# Patient Record
Sex: Female | Born: 1980
Health system: Southern US, Community
[De-identification: ages and names within clinical notes are randomized; demographics above are authoritative.]

## PROBLEM LIST (undated history)

## (undated) DIAGNOSIS — F419 Anxiety disorder, unspecified: Secondary | ICD-10-CM

## (undated) DIAGNOSIS — D649 Anemia, unspecified: Secondary | ICD-10-CM

## (undated) HISTORY — PX: ABDOMINAL HYSTERECTOMY: SHX81

---
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
## 2002-03-14 ENCOUNTER — Other Ambulatory Visit: Admission: RE | Admit: 2002-03-14 | Discharge: 2002-03-14 | Payer: Self-pay | Admitting: Obstetrics & Gynecology

## 2003-03-19 ENCOUNTER — Other Ambulatory Visit: Admission: RE | Admit: 2003-03-19 | Discharge: 2003-03-19 | Payer: Self-pay | Admitting: Obstetrics and Gynecology

## 2003-09-17 ENCOUNTER — Inpatient Hospital Stay (HOSPITAL_COMMUNITY): Admission: AD | Admit: 2003-09-17 | Discharge: 2003-09-17 | Payer: Self-pay | Admitting: Obstetrics and Gynecology

## 2003-10-02 ENCOUNTER — Inpatient Hospital Stay (HOSPITAL_COMMUNITY): Admission: RE | Admit: 2003-10-02 | Discharge: 2003-10-05 | Payer: Self-pay | Admitting: Obstetrics and Gynecology

## 2004-04-23 ENCOUNTER — Other Ambulatory Visit: Admission: RE | Admit: 2004-04-23 | Discharge: 2004-04-23 | Payer: Self-pay | Admitting: Obstetrics and Gynecology

## 2004-10-24 ENCOUNTER — Other Ambulatory Visit: Admission: RE | Admit: 2004-10-24 | Discharge: 2004-10-24 | Payer: Self-pay | Admitting: Obstetrics and Gynecology

## 2005-04-27 ENCOUNTER — Other Ambulatory Visit: Admission: RE | Admit: 2005-04-27 | Discharge: 2005-04-27 | Payer: Self-pay | Admitting: Obstetrics and Gynecology

## 2007-08-10 ENCOUNTER — Inpatient Hospital Stay (HOSPITAL_COMMUNITY): Admission: AD | Admit: 2007-08-10 | Discharge: 2007-08-10 | Payer: Self-pay | Admitting: Obstetrics & Gynecology

## 2008-03-22 ENCOUNTER — Encounter (INDEPENDENT_AMBULATORY_CARE_PROVIDER_SITE_OTHER): Payer: Self-pay | Admitting: Obstetrics and Gynecology

## 2008-03-22 ENCOUNTER — Inpatient Hospital Stay (HOSPITAL_COMMUNITY): Admission: RE | Admit: 2008-03-22 | Discharge: 2008-03-24 | Payer: Self-pay | Admitting: Obstetrics and Gynecology

## 2008-10-22 ENCOUNTER — Emergency Department (HOSPITAL_COMMUNITY): Admission: EM | Admit: 2008-10-22 | Discharge: 2008-10-22 | Payer: Self-pay | Admitting: Emergency Medicine

## 2009-01-21 ENCOUNTER — Ambulatory Visit: Payer: Self-pay | Admitting: Family Medicine

## 2009-01-21 DIAGNOSIS — F988 Other specified behavioral and emotional disorders with onset usually occurring in childhood and adolescence: Secondary | ICD-10-CM | POA: Insufficient documentation

## 2009-01-21 DIAGNOSIS — F411 Generalized anxiety disorder: Secondary | ICD-10-CM | POA: Insufficient documentation

## 2009-01-21 DIAGNOSIS — L503 Dermatographic urticaria: Secondary | ICD-10-CM

## 2009-02-13 ENCOUNTER — Ambulatory Visit: Payer: Self-pay | Admitting: Family Medicine

## 2009-02-13 LAB — CONVERTED CEMR LAB
Cholesterol: 194 mg/dL (ref 0–200)
HDL: 71 mg/dL (ref >39)
LDL Cholesterol: 99 mg/dL (ref 0–99)
Triglycerides: 119 mg/dL (ref <150)
VLDL: 24 mg/dL (ref 0–40)

## 2009-02-20 ENCOUNTER — Ambulatory Visit: Payer: Self-pay | Admitting: Family Medicine

## 2009-02-20 LAB — CONVERTED CEMR LAB
Basophils Absolute: 0.1 10*3/uL (ref 0.0–0.1)
Basophils Relative: 1 % (ref 0–1)
Eosinophils Relative: 2 % (ref 0–5)
Hemoglobin: 13 g/dL (ref 12.0–15.0)
Lymphocytes Relative: 28 % (ref 12–46)
MCHC: 33.4 g/dL (ref 30.0–36.0)
Monocytes Absolute: 0.7 10*3/uL (ref 0.1–1.0)
Neutro Abs: 4.5 10*3/uL (ref 1.7–7.7)
Platelets: 300 10*3/uL (ref 150–400)
RDW: 12.5 % (ref 11.5–15.5)
Sed Rate: 9 mm/hr (ref 0–22)

## 2009-02-21 ENCOUNTER — Encounter (INDEPENDENT_AMBULATORY_CARE_PROVIDER_SITE_OTHER): Payer: Self-pay | Admitting: Family Medicine

## 2009-02-26 ENCOUNTER — Encounter (INDEPENDENT_AMBULATORY_CARE_PROVIDER_SITE_OTHER): Payer: Self-pay | Admitting: Family Medicine

## 2009-03-05 ENCOUNTER — Encounter (INDEPENDENT_AMBULATORY_CARE_PROVIDER_SITE_OTHER): Payer: Self-pay | Admitting: Family Medicine

## 2009-04-16 ENCOUNTER — Encounter (INDEPENDENT_AMBULATORY_CARE_PROVIDER_SITE_OTHER): Payer: Self-pay | Admitting: Family Medicine

## 2009-04-16 ENCOUNTER — Ambulatory Visit: Payer: Self-pay | Admitting: Family Medicine

## 2009-04-16 LAB — CONVERTED CEMR LAB
ALT: 11 units/L (ref 0–35)
Alkaline Phosphatase: 63 units/L (ref 39–117)
BUN: 8 mg/dL (ref 6–23)
CO2: 26 meq/L (ref 19–32)
Calcium: 9.2 mg/dL (ref 8.4–10.5)
Chloride: 103 meq/L (ref 96–112)
Creatinine, Ser: 0.86 mg/dL (ref 0.40–1.20)
Sodium: 139 meq/L (ref 135–145)
Total Bilirubin: 0.3 mg/dL (ref 0.3–1.2)
Total Protein: 7.1 g/dL (ref 6.0–8.3)

## 2009-08-19 ENCOUNTER — Emergency Department (HOSPITAL_COMMUNITY): Admission: EM | Admit: 2009-08-19 | Discharge: 2009-08-19 | Payer: Self-pay | Admitting: Family Medicine

## 2010-04-01 NOTE — Letter (Signed)
Summary: PT INFORMATION SHEET  PT INFORMATION SHEET   Imported By: Arta Bruce 03/13/2009 15:05:06  _____________________________________________________________________  External Attachment:    Type:   Image     Comment:   External Document

## 2010-04-01 NOTE — Letter (Signed)
Summary: RECEIVED RECORDS FROM DR.[facility]  RECEIVED RECORDS FROM DR.KAUR   Imported By: Arta Bruce 05/02/2009 15:28:23  _____________________________________________________________________  External Attachment:    Type:   Image     Comment:   External Document

## 2010-04-01 NOTE — Letter (Signed)
Summary: REQUESTING RECORDS FROM DR.KAUR/PSY.  REQUESTING RECORDS FROM DR.KAUR/PSY.   Imported By: Arta Bruce 04/29/2009 16:00:10  _____________________________________________________________________  External Attachment:    Type:   Image     Comment:   External Document

## 2010-04-01 NOTE — Letter (Signed)
Summary: SLIDING SCALE FEE  SLIDING SCALE FEE   Imported By: Arta Bruce 03/13/2009 15:06:15  _____________________________________________________________________  External Attachment:    Type:   Image     Comment:   External Document

## 2010-05-18 LAB — POCT RAPID STREP A (OFFICE): Streptococcus, Group A Screen (Direct): NEGATIVE

## 2010-06-16 LAB — CBC
HCT: 29.9 % — ABNORMAL LOW (ref 36.0–46.0)
HCT: 34 % — ABNORMAL LOW (ref 36.0–46.0)
Hemoglobin: 10.2 g/dL — ABNORMAL LOW (ref 12.0–15.0)
Hemoglobin: 11.5 g/dL — ABNORMAL LOW (ref 12.0–15.0)
Platelets: 166 10*3/uL (ref 150–400)
RBC: 3.19 MIL/uL — ABNORMAL LOW (ref 3.87–5.11)
RBC: 3.7 MIL/uL — ABNORMAL LOW (ref 3.87–5.11)
WBC: 9.7 10*3/uL (ref 4.0–10.5)

## 2010-06-16 LAB — URINALYSIS, ROUTINE W REFLEX MICROSCOPIC
Glucose, UA: NEGATIVE mg/dL
Protein, ur: NEGATIVE mg/dL
Specific Gravity, Urine: 1.01 (ref 1.005–1.030)
pH: 7 (ref 5.0–8.0)

## 2010-07-15 NOTE — Op Note (Signed)
NAME:  Donna Ritter, Donna Ritter NO.:  [id number]   MEDICAL RECORD NO.:  [id number]          PATIENT TYPE:  INP   LOCATION:  9134                          [facility]   PHYSICIAN:  Janine Limbo, M.D.DATE OF BIRTH:  1980-07-29   DATE OF PROCEDURE:  DATE OF DISCHARGE:                               OPERATIVE REPORT   PREOPERATIVE DIAGNOSES:  1. Term intrauterine gestation.  2. Prior cesarean section.  3. Desires repeat cesarean section.  4. Nevus.   POSTOPERATIVE DIAGNOSES:  1. Term intrauterine gestation.  2. Prior cesarean section.  3. Desires repeat cesarean section.  4. Nevus.   PROCEDURE:  1. Repeat low transverse cesarean section.  2. Removal of nevus.   SURGEON:  Janine Limbo, MD   FIRST ASSISTANT:  Candice Denny Levy, CNM   ANESTHETIC:  Spinal with local Marcaine.   DISPOSITION:  Ms. Donna Ritter is a 30 year old female, gravida 2, para 1-0-  0-1, who presents at 67 weeks' gestation (EDC is March 29, 2008).  The  patient has been followed at the [facility] and  Gynecology division of [facility].  She has had a  prior cesarean section and she desires a repeat cesarean section.  She  also has a raised nevus in her right upper quadrant that is irritated by  her undergarments.  She wishes to have it removed.  The patient  understands the indications for her surgical procedure, and she accepts  the risks of, but not limited to, anesthetic complications, bleeding,  infection, and possible damage to the surrounding organs.   FINDINGS:  An 8 pound female infant Dierdre Forth) was delivered without  difficulty.  The Apgars were 8 at 1 minute and 9 at 5 minutes.  There  was a 1-cm radius hyperpigmented nevus in the right upper quadrant that  was removed without difficulty.  The uterus, fallopian tubes, and  ovaries appeared normal for the gravid state.   PROCEDURE:  The patient was taken to the operating room where  a spinal  anesthetic was given.  The patient's abdomen, perineum, and vagina were  prepped with multiple layers of Betadine.  A Foley catheter was placed  in the bladder.  The patient was then sterilely draped.  The lower  quadrant was injected using 10 mL of 0.5% Marcaine with epinephrine.  A  low transverse incision was made and carried sharply through the  subcutaneous tissue, the fascia, and the anterior peritoneum.  An  incision was made in the lower uterine segment and it was extended in a  low-transverse fashion.  The fetal head was delivered without  difficulty.  The mouth and nose were suctioned.  The remainder of the  infant was then delivered.  The cord was clamped and cut, and the infant  was handed to waiting pediatric team.  The placenta was removed and was  passed to the cord blood registry team.  The placenta was then sent to  Labor and Delivery.  The uterine cavity was cleaned of amniotic fluid,  clotted blood, and membranes.  The uterine incision was closed using a  running locking suture of 0 Vicryl followed by an imbricating suture of  0 Vicryl.  The pelvis was vigorously irrigated.  Hemostasis was  adequate.  The anterior peritoneum and the abdominal musculature were  reapproximated in the midline using 2-0 Vicryl.  The fascia was closed  using a running suture of 0 Vicryl followed by 3 interrupted sutures of  0 Vicryl.  The subcutaneous layer was closed using 0 Vicryl.  The skin  was reapproximated using a subcuticular suture of 3-0 Monocryl.  The  nevus in the right upper quadrant was then injected with 5 mL of 0.5%  Marcaine with epinephrine.  An elliptical incision was made removing the  nevus in its entirety.  Hemostasis was achieved using the Bovie cautery.  The skin was reapproximated using 3-0 Monocryl.  Sponge, needle, and  instrument counts were correct on 2 occasions.  Estimated blood loss for  the procedure was 800 mL.  The patient tolerated her procedure  well.  She was transported to the recovery room in stable condition.  The  infant was taken to the full-term nursery in stable condition.  The  nevus was sent to pathology for evaluation.  The placenta was sent to  Labor and Delivery.      Janine Limbo, M.D.  Electronically Signed     AVS/MEDQ  D:  03/22/2008  T:  03/22/2008  Job:  [id number]

## 2010-07-15 NOTE — H&P (Signed)
NAME:  Donna Ritter NO.:  [id number]   MEDICAL RECORD NO.:  [id number]          PATIENT TYPE:  INP   LOCATION:  9199                          [facility]   PHYSICIAN:  Janine Limbo, M.D.DATE OF BIRTH:  10-07-1980   DATE OF ADMISSION:  03/22/2008  DATE OF DISCHARGE:                              HISTORY & PHYSICAL   Ms. Donna Ritter is a 30 year old single white female gravida 2, para 1-0-0-  1 at 65 weeks' gestation per an [facility] of March 29, 2008 who presents  today on date of admission for a scheduled repeat low transverse  cesarean section.  The patient is without complaints this morning.  She  has been followed by a nurse midwife service at [facility].  Her history is  remarkable for:   1. History of anxiety for which she takes Prozac 40 mg daily.  2. A previous C-section for failed version and breech presentation      with desire for a repeat.  3. A son born with SVT at birth that has since resolved as he has      aged.  4. History of fibroids.   PRENATAL LABORATORIES:  Her blood type is B+, Rh antibody screen  negative, RPR nonreactive, rubella titer immune.  Hepatitis surface  antigen negative, HIV nonreactive.  Pap within normal limits.  Gonorrhea  and chlamydia cultures negative.  CF negative.  July 16th her hemoglobin  was 11.8, hematocrit 35.8, and platelets were 285.  She did receive H1N1  vaccine October 30th.  Her 1-hour GTT was within normal limits equal to  128.   ALLERGIES:  She denies medication or latex allergies.   MEDICATIONS THAT SHE TAKES DAILY:  1. She is on Prozac 40 mg p.o. daily.  2. She takes a prenatal vitamin 1 tab p.o. daily.  3. She takes Zantac 150 p.r.n.   OBSTETRICAL HISTORY:  Gravida 1 was a C-section for breech presentation  after failed version, transverse lie.  Her child's name is Donna Ritter.  He  was born August of 2005.  Weight 7 pounds 12 ounces at 38-1/2 weeks.  She had a spinal, and that was by Dr. Stefano Gaul and  Wynelle Bourgeois,  C.N.M.  Gravida 2 is current pregnancy.   MENSTRUAL HISTORY:  She reports menarche at age 50, monthly cycles, no  abnormalities.  She had an LMP of June 23, 2007 giving her an [facility] of  March 29, 2008.   PAST MEDICAL HISTORY:  She does report a history of abnormal Pap smears  2005.  Her repeats have been within normal limits.  Fibroid she said was  diagnosed in June of 2009.  It was not mentioned on her ultrasound from  2005 or operative note.  One 2 cm fibroid was seen on her ultrasound  posterior side of uterus in early pregnancy.  She has had occasional  yeast infections, varicella as a child, anxiety.  She is followed by Dr.  Evelene Croon I believe.  Had previously been on Lexapro and switched to Prozac.  Had originally been on 20 mg, and had been increased to 40 mg.  PAST SURGICAL HISTORY:  Her only surgical history is a C-section in  2005.   FAMILY HISTORY:  Remarkable for paternal grandfather and maternal  grandmother heart disease.  They both have chronic hypertension.  Mother  asthma and COPD.  Paternal grandfather diabetes.  Paternal grandmother I  am guessing at seizure disorder. Her mother, brother, and maternal  grandmother depression.  Father anxiety.  Mother, father nicotine  addiction.  Mother alcohol addiction.   GENETIC HISTORY:  Remarkable for son was born with SVTs.  Had been on  medication.  It has since resolved as he has aged.  The patient's  brother is autistic.   SOCIAL HISTORY:  She is a single white female.  Father of baby's name is  Donna Ritter.  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  Had 15 years of  education.  The father of the baby works full-time in Set [employer].  Has had 11 years of education.  The patient denied alcohol, tobacco or  illicit drug use.   HISTORY OF PRESENT PREGNANCY:  She entered care August 25, 2007 for her  new OB interview.  She is around [redacted] weeks pregnant.  She returned for her  new OB workup July 16 at 11-6/7  weeks.  Her weight was 159.  She  reported her pregravid weight was around 160.  Her height is 5 feet 4-  1/2 inches.  She planned CNM care.  Did plan 1st trimester screen.  She  had originally considered trying VBAC, and then later did decide to go  ahead with a repeat C-section.  As was mentioned she had a normal 1-hour  GTT around 27 weeks.  It was 128.  She did receive both flu vaccines,  and her pregnancy continued to progress without any other noted  complications until her presentation today.   OBJECTIVE:  She is afebrile.  Her vital signs are stable.   PHYSICAL EXAMINATION:  GENERAL:  No acute distress, alert and oriented  x3, and is within normal limits, grossly intact.  She does have glasses.  CARDIOVASCULAR:  Regular rate and rhythm without murmur.  LUNGS:  Clear to auscultation bilaterally.  ABDOMEN:  Soft, nontender, and gravid.  In her right upper quadrant of  her abdomen she does have I would say probably 0.5 cm round mole that  patient would like for [place] to remove.  She is to discuss this further  with Dr. Stefano Gaul. PELVIC:  Was deferred.  EXTREMITIES:  Are within normal limits.  No clonus and DTRs were 2+.   IMPRESSION:  1. Intrauterine pregnancy at 26 weeks' gestation.  2. Previous cesarean section with desire for repeat.  3. Anxiety and currently on Prozac 40 mg p.o. daily and stable.   PLAN:  1. Admit to [facility] with Dr. Leonard Schwartz as attending physician.  2. Routine preoperative orders.  3. Risks, benefits, alternatives of a repeat cesarean section      discussed with patient including but not limited to the risk of      infection, risk of bleeding, and damage to surrounding organs.   After review and discussion the patient desires to continue with  previous plan for a repeat low transverse cesarean section.  Dr.  Stefano Gaul is to follow and answer any outstanding questions.      Candice Denny Levy, [place]       Janine Limbo, M.D.  Electronically Signed    CHS/MEDQ  D:  03/22/2008  T:  03/22/2008  Job:  [id number]

## 2010-07-15 NOTE — Discharge Summary (Signed)
NAME:  Donna Ritter, Donna Ritter NO.:  [id number]   MEDICAL RECORD NO.:  [id number]          PATIENT TYPE:  INP   LOCATION:  9134                          [facility]   PHYSICIAN:  Hal Morales, M.D.DATE OF BIRTH:  08/09/1980   DATE OF ADMISSION:  03/22/2008  DATE OF DISCHARGE:  03/24/2008                               DISCHARGE SUMMARY   ADMITTING DIAGNOSES:  1. Intrauterine pregnancy at 31 weeks' gestation.  2. Previous cesarean section with desire for repeat.  3. History of anxiety, stable on Prozac 40 mg p.o. daily.   DISCHARGE DIAGNOSES:  1. Intrauterine pregnancy at 62 weeks' gestation.  2. Previous cesarean section with desire for repeat.  3. History of anxiety, stable on Prozac 40 mg p.o. daily.  4. Lactating as well as status post repeat cesarean section with      findings of a viable female infant born on March 22, 2008, 9:29      a.m., named Donna Ritter, weight 8 pounds even and which was 3635 g,      length was 20.25 inches.  Her Apgars were 8 and 9.   PROCEDURES:  1. Spinal anesthesia.  2. Repeat low transverse cesarean section.   HOSPITAL COURSE:  Ms. Donna Ritter is a 30 year old single white female,  gravida 2, para 1-0-0-1, who presented on the day of admission for a  scheduled repeat low transverse cesarean section at 25 weeks' gestation  per an [facility] of March 29, 2008.  She has been followed by midwife  service at [facility].   History had been remarkable for:  1. History of anxiety which she was stable on Prozac 40 mg p.o. daily.  2. Previous C-section for failed version in breech presentation and      desires repeat C-section for this pregnancy.  3. Her older son was born with SVTs at birth and had since resolved.  4. History of fibroids.   Blood type was B positive, rubella immune, Rh antibody screen was  negative.  The patient was admitted with routine preoperative orders.  After risks, benefits, and alternatives were again discussed with the  patient regarding a repeat cesarean section.  She did verbalize desire  to proceed with a plan for the surgery.  The patient was taken to OR  where spinal anesthesia was performed and a repeat low transverse  cesarean section was performed per Dr. Leonard Schwartz, attending  physician; assisting; Denny Levy, certified nurse midwife.  She  tolerated the procedure well.  She did also have a nevus,  which was  removed at completion of her surgery in her right upper quadrant, which  was sent for pathology.  She tolerated the procedure well.  She was sent  to recovery in good condition and newborn was sent to full-term nursery  in good condition as well.  EBL was 800 mL.  By postoperative #1, no  heavy bleeding, was voiding without difficulty, positive flatulence,  pain was well controlled, was breast-feeding with good latch and Prozac  continued as previously prescribed.  She was afebrile.  Her vital signs  were stable.  CBC on  postop day #1, her white count was 9.7, previously  had been 9.9.  Her hemoglobin was 10.2, previously 11.5.  Hematocrit was  29.9 and previously was 34, platelets were stable at 166, which was down  from 207.  Her physical exam was within normal limits.  She had bowel  sounds present, mild distention, fundus was firm 1 minus 2, she had  light lochia.  Her abdominal dressing which was still her postoperative  dressing was clean, dry, and intact.  She had no edema and negative  Homan signs.  Routine postpartum care continued.  She was started on  iron b.i.d. and continued on her Prozac.  She did have a social work  consult on that day as well for her history of anxiety.  By  postoperative day #2, she was doing very well, was verbalizing desire  for early discharge.  She is planning a Mirena postpartum.  She was  offered Depo-Provera shot today for interim, but declined and does  desire Micronor instead.  She is voiding without difficulty.  She has  had a  bowel movement since delivery, which was slightly hard per her  recall.  She has had very light vaginal bleeding, tolerating regular  diet, and p.o. pain meds.  Incision sheet reports does have some burning  occasionally with position changes, but otherwise well controlled with  Percocet p.r.n. and Motrin.  She has no dizziness when she is up.  Her  nipples were slightly sore.  She is afebrile.  Her vital signs are  stable.  This morning, she was 97.4 temperature and her blood pressure  was 102/69.   PHYSICAL EXAMINATION:  GENERAL:  She is alert and oriented and pleasant  in no acute distress.  She was smiling and engaged.  LUNGS:  Clear.  ABDOMEN:  Soft.  It was slightly tender around the periumbilical area.  Incision was open to air.  No drainage, signs, or symptoms of  infections.  She has a Band-Aid in right upper quadrant where she had a  nevus removal.  Lochia, scant rubra.  EXTREMITIES:  Generalized edema.  Negative Homan.   The patient was deemed to have received full benefit of her hospital  stay.  Discharged home in stable condition on postoperative day #2.   DISCHARGE INSTRUCTIONS:  Per [facility] pamphlet.  Warning signs and symptoms  report reviewed.   DISCHARGE MEDICATIONS:  1. Motrin 600 mg p.o. q.6 h. p.r.n. pain.  2. Percocet 5/325 one tablet p.o. q.4 h. p.r.n. moderate to severe      pain or 2 tablets p.o. q.6 h. p.r.n. moderate to severe pain.  3. A stool softener of choice either Colace or Senokot generic 1      tablet p.o. q.a.m., may repeat a second dose in p.m.  4. Micronor 1 tablet p.o. daily.  She is to start that 2-3 weeks from      tomorrow with Sunday start.  5. Prozac 40 mg p.o. daily.  6. She is to continue her prenatal vitamin 1 tablet p.o. daily.  7. She may obtain Slow Fe over-the-counter to take 1 tablet p.o. daily      or one tablet p.o. every other day.   Followup is to occur in 6 weeks or p.r.n.      Candice La Pryor, [place]       Hal Morales, M.D.  Electronically Signed    CHS/MEDQ  D:  03/24/2008  T:  03/24/2008  Job:  [id number]

## 2010-07-18 NOTE — Op Note (Signed)
NAME:  Donna Ritter, Donna Ritter NO.:  [id number]   MEDICAL RECORD NO.:  [id number]                   PATIENT TYPE:  MAT   LOCATION:  MATC                                 [facility]   PHYSICIAN:  Janine Limbo, M.D.            DATE OF BIRTH:  [birth year]   DATE OF PROCEDURE:  09/17/2003  DATE OF DISCHARGE:                                 OPERATIVE REPORT   PREOPERATIVE DIAGNOSES:  1. Thirty-six weeks' gestation.  2. Transverse lie.   POSTOPERATIVE DIAGNOSES:  1. Thirty-six weeks' gestation.  2. Transverse lie.   PROCEDURE:  Unsuccessful external version.   SURGEON:  Janine Limbo, M.D.   FIRST ASSISTANT:  Renaldo Reel. Emilee Hero, C.N.M.   ANESTHESIA:  None.   DISPOSITION:  [name] is a 30 year old female, gravida 1, para 0, who  has been followed at the [facility] and Gynecology Division  of [facility] for this pregnancy that has been largely  uncomplicated.  She was found to have an infant in a transverse  presentation.  We discussed our options for management, which included  primary cesarean delivery, vaginal breech delivery, and external version.  After discussing the risks and benefits of all of those options, the patient  elected to attempt an external version.  We reviewed the specific risks of  external version, including the risk of fetal stress and the possibility of  cesarean delivery.   FINDINGS:  The patient's blood type is B positive.  An ultrasound was  performed that showed a single intrauterine gestation in a transverse  presentation.  The amniotic fluid volume was normal.  The placenta appeared  normal.   PROCEDURE:  The patient was seen in maternity admissions and a nonstress  test was performed.  The nonstress test was reactive.  The patient was given  0.25 mg of subcu terbutaline.  An ultrasound was performed with findings as  mentioned above.  The patient's abdomen was covered with  gel.  Several  attempts were made to turn the infant.  First we tried two attempts with a  counterclockwise motion.  These attempts were unsuccessful.  We then tried  two attempts in a clockwise direction.  These attempts were also  unsuccessful.  The infant was monitored with the ultrasound as we attempted  each version.  The fetal heart motion appeared normal throughout.  The  patient was placed back on the nonstress test machine and initially the  nonstress test was reassuring.   FOLLOW-UP PLANS:  We discussed our options at this point, and the patient  elects to proceed with primary cesarean delivery.  We will schedule her  procedure for 38-39 weeks' gestation.  Janine Limbo, M.D.    AVS/MEDQ  D:  09/17/2003  T:  09/17/2003  Job:  [id number]

## 2010-07-18 NOTE — Op Note (Signed)
NAME:  Donna Ritter, Donna Ritter NO.:  [id number]   MEDICAL RECORD NO.:  [id number]                   PATIENT TYPE:  INP   LOCATION:  9142                                 [facility]   PHYSICIAN:  Janine Limbo, M.D.            DATE OF BIRTH:  [birth year]   DATE OF PROCEDURE:  10/02/2003  DATE OF DISCHARGE:                                 OPERATIVE REPORT   PREOPERATIVE DIAGNOSES:  1. Term intrauterine pregnancy.  2. Transverse lie.   POSTOPERATIVE DIAGNOSES:  1. Term intrauterine pregnancy.  2. Transverse lie.   OPERATION PERFORMED:  Primarly low transverse cesarean section.   SURGEON:  Janine Limbo, M.D.   ASSISTANT:  Elby Showers. Williams, C.N.M.   ANESTHESIA:  Spinal.   DISPOSITION:  Ms. Donna Ritter is a 30 year old female, gravida 1, para 0, who  presents at [redacted] weeks gestation.  She has been followed at the [facility] and Gynecology division of [facility] but this pregnancy has been largely uncomplicated.  She was found to  have an infant in a transverse lie.  External version was attempted but was  unsuccessful.  The patient understands the indications for her surgical  procedure and she accepts the risks of, but not limited to, anesthetic  complications, bleeding, infections, and possible damage to the surrounding  organs.   FINDINGS:  A 7 pound 13 ounce female infant Donna Ritter) was delivered from a back  up transverse lie.  The fetal head was on the maternal right.  The Apgars  were 7 at one minute and 9 at five minutes.  The uterus, fallopian tubes and  the ovaries were normal for the gravid state.  An ultrasound was performed  prior to going to the operating room and the patient was found to have a  transverse infant.  The amniotic fluid volume was normal.  The placenta  appeared normal and was fundal.  Fetal heart motions were normal.   DESCRIPTION OF PROCEDURE:  The patient was taken to the  operating room where  a spinal anesthetic was given.  The patient's abdomen, perineum and outer  vagina were prepped with multiple layers of Betadine.  A Foley catheter was  placed in the bladder.  The patient was sterilely draped.  The lower abdomen  was injected with 10 mL of 0.5% Marcaine with epinephrine.  A low transverse  incision was made in the abdomen and carried sharply through the  subcutaneous tissue, the fascia, and the anterior peritoneum.  The bladder  flap was developed.  Incision was made in the lower uterine segment and  extended transversely.  The infant was delivered from a breech extraction.  The cord was clamped and cut and the infant was handed to Dr. Eric Form from  the Pediatric team.  Routine cord blood studies were obtained.  The placenta  was removed.  The uterine cavity was cleaned of  amniotic fluid, clotted  blood, and membranes.  The uterine incision was closed using a running  locking suture of 2-0 Vicryl followed by an imbricating suture of 2-0  Vicryl.  Hemostasis was adequate.  The pelvis was vigorously irrigated.  Again, hemostasis was adequate.  The anterior peritoneum and the abdominal  musculature were reapproximated in the midline using 2-0 Vicryl.  The fascia  was closed using a running suture of 0 Vicryl followed by three interrupted  sutures of 0 Vicryl.  The subcutaneous tissue was irrigated.  Hemostasis was  adequate.  The subcutaneous layer was closed using a running layer of 0  Vicryl.  The skin was reapproximated using a subcuticular suture of 4-0  Vicryl. Sponge, needle and instrument counts were correct on two occasions.  The estimated blood loss was 800 mL.  The patient tolerated the procedure  well.  She was taken to the recovery room in stable condition.  The infant  was taken to the full term nursery in stable condition.                                               Janine Limbo, M.D.    AVS/MEDQ  D:  10/02/2003  T:  10/02/2003   Job:  [id number]

## 2010-07-18 NOTE — H&P (Signed)
NAME:  Donna Ritter, GREINER NO.:  [id number]   MEDICAL RECORD NO.:  [id number]                   PATIENT TYPE:  INP   LOCATION:  NA                                   FACILITY:  [facility]   PHYSICIAN:  Janine Limbo, M.D.            DATE OF BIRTH:  05-04-1980   DATE OF ADMISSION:  DATE OF DISCHARGE:                                HISTORY & PHYSICAL   DATE OF SURGERY:  October 02, 2003   HISTORY OF PRESENT ILLNESS:  Ms. Donna Ritter is a 30 year old female gravida 1  para 0 who presents at [redacted] weeks gestation (EDC is October 11, 2003).  The  patient has been followed at the [facility] of [facility].  This pregnancy has been  complicated by the fact that her infant is in a breech presentation.  An  external version was attempted but was unsuccessful.  She now elects to  proceed with cesarean delivery.   DRUG ALLERGIES:  None known.   PAST MEDICAL HISTORY:  The patient has a history of anxiety.  She has taken  Zoloft in the past but is currently not taking any medication.   SOCIAL HISTORY:  The patient denies cigarette use, alcohol use, and  recreational drug use.   REVIEW OF SYSTEMS:  Please see history of present illness.   FAMILY HISTORY:  The patient has a brother with anxiety and a father with  anxiety.  Her mother suffers from depression as does her maternal  grandmother.   PHYSICAL EXAMINATION:  VITAL SIGNS:  Weight is 154 pounds.  HEENT:  Within normal limits.  CHEST:  Clear.  HEART:  Regular rate and rhythm.  BREASTS:  Without masses.  ABDOMEN:  Gravid with a fundal height of 35 cm.  EXTREMITIES:  Within normal limits.  NEUROLOGIC:  Grossly normal.  PELVIC:  Cervix was closed and long when last checked.   LABORATORY VALUES:  Blood type is B positive, antibody screen negative.  Toxoplasmosis negative.  VDRL nonreactive.  Rubella positive.  Hepatitis B  surface antigen negative.  HIV  nonreactive.  Pap smear within normal limits.  Cystic fibrosis is negative.  Third trimester beta strep is negative.  Third  trimester gonorrhea negative.  Third trimester chlamydia is negative.   ASSESSMENT:  1. Thirty-eight-week gestation.  2. Breech presentation.   PLAN:  The patient will undergo a primary low transverse cesarean section.  She understands the indications for her procedure and she accepts the risks  of, but not limited to, anesthetic complications, bleeding, infections, and  possible damage to the surrounding organs.                                               Janine Limbo, M.D.  AVS/MEDQ  D:  09/28/2003  T:  09/28/2003  Job:  [id number]

## 2010-07-18 NOTE — Discharge Summary (Signed)
NAME:  Donna Ritter, Donna Ritter NO.:  [id number]   MEDICAL RECORD NO.:  [id number]                   PATIENT TYPE:  INP   LOCATION:  9146                                 FACILITY:  [facility]   PHYSICIAN:  Janine Limbo, M.D.            DATE OF BIRTH:  1981-01-05   DATE OF ADMISSION:  10/02/2003  DATE OF DISCHARGE:  10/05/2003                                 DISCHARGE SUMMARY   ADMISSION DIAGNOSE:  1. Intrauterine pregnancy at 38 weeks.  2. Transverse presentation.   DISCHARGE DIAGNOSES:  1. Intrauterine pregnancy at 38 weeks.  2. Transverse presentation.  3. Status post cesarean delivery of a female infant, named Gerilyn Pilgrim, weighing 7     pounds 13 ounces, Apgars 7 and 9.   HOSPITAL PROCEDURES:  1. Spinal anesthesia.  2. Primary low transverse cesarean section.   HOSPITAL COURSE:  The patient was admitted for an elective primary low  transverse cesarean section for transverse lie of the fetus.  This delivery  was accomplished of a female infant, named Gerilyn Pilgrim, weighing 7 pounds 13 ounces,  Apgars 7 and 9, with no complications.  Estimated blood loss was 800 mL.  On  postoperative day #1, the patient was doing well, breastfeeding.  Vital  signs were stable.  Hemoglobin was 9.8 and routine postoperative care was  given.  On postoperative day #2, she continued to improve, and on  postoperative day #3, chest was clear.  Heart rate regular and rhythm.  Abdomen soft and appropriately tender.  Incision was clean, dry and intact  with subcuticular stitches.  Lochia was small.  Extremities within normal  limits.  The patient was deemed to have received the full benefit of her  hospital stay and was discharged home.   DISCHARGE LABORATORY DATA:  White blood cell 11.0, hemoglobin 9.8, platelet  count 153,000. RPR nonreactive.   DISCHARGE MEDICATIONS:  1. Motrin 600 mg p.o. q.6h. p.r.n.  2. Tylox one to two p.o. q.4h. p.r.n.  3. Micronor one p.o. every day.   DISCHARGE  INSTRUCTIONS:  Per [facility] handout.   DISCHARGE FOLLOW UP:  Six weeks.     Marie L. Williams, C.N.M.                 Janine Limbo, M.D.    MLW/MEDQ  D:  10/05/2003  T:  10/07/2003  Job:  [id number]

## 2010-11-27 LAB — URINALYSIS, ROUTINE W REFLEX MICROSCOPIC
Glucose, UA: NEGATIVE
Protein, ur: NEGATIVE
Specific Gravity, Urine: <1.005 — ABNORMAL LOW

## 2010-11-27 LAB — GC/CHLAMYDIA PROBE AMP, GENITAL: GC Probe Amp, Genital: NEGATIVE

## 2010-11-27 LAB — WET PREP, GENITAL: Trich, Wet Prep: NONE SEEN

## 2015-06-13 DIAGNOSIS — R7301 Impaired fasting glucose: Secondary | ICD-10-CM | POA: Insufficient documentation

## 2016-06-04 ENCOUNTER — Other Ambulatory Visit: Payer: Self-pay | Admitting: *Deleted

## 2016-06-04 DIAGNOSIS — N920 Excessive and frequent menstruation with regular cycle: Secondary | ICD-10-CM

## 2016-06-17 ENCOUNTER — Encounter: Payer: Self-pay | Admitting: Obstetrics & Gynecology

## 2016-06-17 ENCOUNTER — Other Ambulatory Visit: Payer: Self-pay | Admitting: Obstetrics & Gynecology

## 2016-06-17 ENCOUNTER — Ambulatory Visit (INDEPENDENT_AMBULATORY_CARE_PROVIDER_SITE_OTHER): Payer: Commercial Managed Care - PPO | Admitting: Obstetrics & Gynecology

## 2016-06-17 ENCOUNTER — Ambulatory Visit (INDEPENDENT_AMBULATORY_CARE_PROVIDER_SITE_OTHER): Payer: Commercial Managed Care - PPO

## 2016-06-17 DIAGNOSIS — N852 Hypertrophy of uterus: Secondary | ICD-10-CM

## 2016-06-17 DIAGNOSIS — D251 Intramural leiomyoma of uterus: Secondary | ICD-10-CM

## 2016-06-17 DIAGNOSIS — N939 Abnormal uterine and vaginal bleeding, unspecified: Secondary | ICD-10-CM

## 2016-06-17 DIAGNOSIS — N83201 Unspecified ovarian cyst, right side: Secondary | ICD-10-CM | POA: Diagnosis not present

## 2016-06-17 DIAGNOSIS — Z3041 Encounter for surveillance of contraceptive pills: Secondary | ICD-10-CM

## 2016-06-17 DIAGNOSIS — N920 Excessive and frequent menstruation with regular cycle: Secondary | ICD-10-CM

## 2016-06-17 MED ORDER — NORGESTIMATE-ETH ESTRADIOL 0.25-35 MG-MCG PO TABS: 1.0000 | ORAL_TABLET | Freq: Every day | ORAL | 1 refills | Status: DC

## 2016-06-17 NOTE — Progress Notes (Signed)
    Donna Ritter 01/16/1981 [id number]        36 y.o.  G2P2 married, 2 C/Ss.  On Sprintec.  Presenting for investigation of Heavy Menses with a Pelvic US.  Past medical history,surgical history, problem list, medications, allergies, family history and social history were all reviewed and documented in the EPIC chart.  Directed ROS with pertinent positives and negatives documented in the history of present illness/assessment and plan.  Exam:  There were no vitals filed for this visit. General appearance:  Normal  Pelvic US today:  Uterus Anteverted, enlarged with an IM Myoma 11.6 x 9.9 x 11 cm with Endometrial cavity displacement.  Rt ovary with 2 follicular cysts, Left ovary normal.  Assessment/Plan:  36 y.o.   1. Menorrhagia with regular cycle Pelvic US as above.  Large IM/displacing Endometrium 11.6 cm.  No pelvic pain.  Decision to control heavy menses with Verdia Kuba IUD under US guidance.  Info and pamphlet given.  Alternative treatments discussed thoroughly including Nexplanon, DepoProvera.  Already on BCP and tried continuous use without success.  2. Encounter for surveillance of contraceptive pills Sprintec represcribed x 1 pack (refill x 1) to be continued until [place] IUD insertion.  Counseling >50% x 15 min on above issues.  [name] MD, 3:41 PM 06/17/2016

## 2016-06-17 NOTE — Patient Instructions (Signed)
A Pelvic US was done today to investigate your heavy menses.  A large Fibroid was found in the muscle of your uterus (largest diameter 11.6 cm), displacing the uterine cavity.  Your Uterus and Ovaries were otherwise normal.  We discussed different ways to control your heavy periods and decided to proceed with [facility] IUD insertion under US guidance.  Risks/Benefits/procedure were reviewed and a pamphlet was provided.  I sent you 1 pack of Sprintec (refill x 1) to allow you time to organize for the f/u IUD insertion.  Continue on the BCP until IUD insertion.  It was a pleasure to see you today!

## 2016-07-10 ENCOUNTER — Telehealth: Payer: Self-pay | Admitting: *Deleted

## 2016-07-10 MED ORDER — NORGESTIMATE-ETH ESTRADIOL 0.25-35 MG-MCG PO TABS: 1.0000 | ORAL_TABLET | Freq: Every day | ORAL | 11 refills | Status: DC

## 2016-07-10 NOTE — Telephone Encounter (Signed)
Left on pt voicemail Rx sent. 

## 2016-07-10 NOTE — Telephone Encounter (Signed)
Agree with Sprintec prescription.

## 2016-07-10 NOTE — Telephone Encounter (Signed)
Pt had OV on 06/17/16 has declined to proceed with [facility] IUD, would like to continue on birth control pills Sprintec. Okay to send Rx?

## 2016-12-15 ENCOUNTER — Telehealth: Payer: Self-pay | Admitting: *Deleted

## 2016-12-15 MED ORDER — NORGESTIMATE-ETH ESTRADIOL 0.25-35 MG-MCG PO TABS: 1.0000 | ORAL_TABLET | Freq: Every day | ORAL | 3 refills | Status: DC

## 2016-12-15 NOTE — Telephone Encounter (Signed)
Patient called requesting birth control pills be sent to [facility]. Rx sent.

## 2017-05-13 ENCOUNTER — Encounter: Payer: Self-pay | Admitting: Obstetrics & Gynecology

## 2017-05-13 ENCOUNTER — Ambulatory Visit (INDEPENDENT_AMBULATORY_CARE_PROVIDER_SITE_OTHER): Payer: Commercial Managed Care - PPO | Admitting: Obstetrics & Gynecology

## 2017-05-13 VITALS — BP 132/86 | Ht 64.0 in | Wt 211.0 lb

## 2017-05-13 DIAGNOSIS — Z3041 Encounter for surveillance of contraceptive pills: Secondary | ICD-10-CM

## 2017-05-13 DIAGNOSIS — N920 Excessive and frequent menstruation with regular cycle: Secondary | ICD-10-CM | POA: Diagnosis not present

## 2017-05-13 DIAGNOSIS — D649 Anemia, unspecified: Secondary | ICD-10-CM | POA: Diagnosis not present

## 2017-05-13 DIAGNOSIS — Z01411 Encounter for gynecological examination (general) (routine) with abnormal findings: Secondary | ICD-10-CM | POA: Diagnosis not present

## 2017-05-13 DIAGNOSIS — D219 Benign neoplasm of connective and other soft tissue, unspecified: Secondary | ICD-10-CM | POA: Diagnosis not present

## 2017-05-13 MED ORDER — NORETHIN ACE-ETH ESTRAD-FE 1-20 MG-MCG(24) PO TABS: 1.0000 | ORAL_TABLET | Freq: Every day | ORAL | 5 refills | Status: DC

## 2017-05-13 NOTE — Patient Instructions (Signed)
1. Encounter for gynecological examination with abnormal finding Gynecologic exam with large nodular fibroid uterus.  Pap is negative with negative high risk HPV in March 2018.  Breast exam normal.  2. Encounter for surveillance of contraceptive pills Will continue birth control pill for contraception, but switched to [place] FE 1/20 continuous use to control menorrhagia with secondary anemia.  3. Fibroids Management of symptomatic uterine fibroids discussed.  Medical/hormonal treatments reviewed.  Conservative surgery with hysteroscopy/submucosal myomectomy/endometrial ablation reviewed.  Uterine size/fibroids probably too large to address the problem with endometrial ablation.  The risks and benefits of hysterectomy reviewed.  Patient does not want to preserve fertility.  Decision to change her birth control pill to a lower dosage and use it continuously.  If that controls her menorrhagia efficiently and resolves the secondary anemia, will continue on that.  If not will follow-up to recheck the uterine fibroids by pelvic ultrasound and organize hysterectomy.  Will proceed with XI Robotic Total Laparoscopic Hysterectomy with Bilateral Salpingectomy if no improvement on continuous [place] Fe 1/20 (24).  Patient voiced understanding and agreement with plan.  4. Menorrhagia with regular cycle As above.  Continue with iron sulfate 3 times a day.  5. Secondary anemia Continue with iron sulfate 3 times a day.  Other orders - Fe Bisgly-Succ-C-Thre-B12-FA (IRON-150 PO); Take by mouth. - Norethindrone Acetate-Ethinyl Estrad-FE (LOESTRIN 24 FE) 1-20 MG-MCG(24) tablet; Take 1 tablet by mouth daily. Continuous use for Menorrhagia with anemia  Donna Ritter, it was a pleasure seeing you today!   Uterine Fibroids Uterine fibroids are tissue masses (tumors) that can develop in the womb (uterus). They are also called leiomyomas. This type of tumor is not cancerous (benign) and does not spread to other parts of the  body outside of the pelvic area, which is between the hip bones. Occasionally, fibroids may develop in the fallopian tubes, in the cervix, or on the support structures (ligaments) that surround the uterus. You can have one or many fibroids. Fibroids can vary in size, weight, and where they grow in the uterus. Some can become quite large. Most fibroids do not require medical treatment. What are the causes? A fibroid can develop when a single uterine cell keeps growing (replicating). Most cells in the human body have a control mechanism that keeps them from replicating without control. What are the signs or symptoms? Symptoms may include:  Heavy bleeding during your period.  Bleeding or spotting between periods.  Pelvic pain and pressure.  Bladder problems, such as needing to urinate more often (urinary frequency) or urgently.  Inability to reproduce offspring (infertility).  Miscarriages.  How is this diagnosed? Uterine fibroids are diagnosed through a physical exam. Your health care provider may feel the lumpy tumors during a pelvic exam. Ultrasonography and an MRI may be done to determine the size, location, and number of fibroids. How is this treated? Treatment may include:  Watchful waiting. This involves getting the fibroid checked by your health care provider to see if it grows or shrinks. Follow your health care provider's recommendations for how often to have this checked.  Hormone medicines. These can be taken by mouth or given through an intrauterine device (IUD).  Surgery. ? Removing the fibroids (myomectomy) or the uterus (hysterectomy). ? Removing blood supply to the fibroids (uterine artery embolization).  If fibroids interfere with your fertility and you want to become pregnant, your health care provider may recommend having the fibroids removed. Follow these instructions at home:  Keep all follow-up visits as directed by  your health care provider. This is  important.  Take over-the-counter and prescription medicines only as told by your health care provider. ? If you were prescribed a hormone treatment, take the hormone medicines exactly as directed.  Ask your health care provider about taking iron pills and increasing the amount of dark green, leafy vegetables in your diet. These actions can help to boost your blood iron levels, which may be affected by heavy menstrual bleeding.  Pay close attention to your period and tell your health care provider about any changes, such as: ? Increased blood flow that requires you to use more pads or tampons than usual per month. ? A change in the number of days that your period lasts per month. ? A change in symptoms that are associated with your period, such as abdominal cramping or back pain. Contact a health care provider if:  You have pelvic pain, back pain, or abdominal cramps that cannot be controlled with medicines.  You have an increase in bleeding between and during periods.  You soak tampons or pads in a half hour or less.  You feel lightheaded, extra tired, or weak. Get help right away if:  You faint.  You have a sudden increase in pelvic pain. This information is not intended to replace advice given to you by your health care provider. Make sure you discuss any questions you have with your health care provider. Document Released: 02/14/2000 Document Revised: 10/17/2015 Document Reviewed: 08/15/2013 Elsevier Interactive Patient Education  2018 [place] Maintenance, Female Adopting a healthy lifestyle and getting preventive care can go a long way to promote health and wellness. Talk with your health care provider about what schedule of regular examinations is right for you. This is a good chance for you to check in with your provider about disease prevention and staying healthy. In between checkups, there are plenty of things you can do on your own. Experts have done a lot  of research about which lifestyle changes and preventive measures are most likely to keep you healthy. Ask your health care provider for more information. Weight and diet Eat a healthy diet  Be sure to include plenty of vegetables, fruits, low-fat dairy products, and lean protein.  Do not eat a lot of foods high in solid fats, added sugars, or salt.  Get regular exercise. This is one of the most important things you can do for your health. ? Most adults should exercise for at least 150 minutes each week. The exercise should increase your heart rate and make you sweat (moderate-intensity exercise). ? Most adults should also do strengthening exercises at least twice a week. This is in addition to the moderate-intensity exercise.  Maintain a healthy weight  Body mass index (BMI) is a measurement that can be used to identify possible weight problems. It estimates body fat based on height and weight. Your health care provider can help determine your BMI and help you achieve or maintain a healthy weight.  For females 31 years of age and older: ? A BMI below 18.5 is considered underweight. ? A BMI of 18.5 to 24.9 is normal. ? A BMI of 25 to 29.9 is considered overweight. ? A BMI of 30 and above is considered obese.  Watch levels of cholesterol and blood lipids  You should start having your blood tested for lipids and cholesterol at 35 years of age, then have this test every 5 years.  You may need to have your cholesterol levels  checked more often if: ? Your lipid or cholesterol levels are high. ? You are older than 35 years of age. ? You are at high risk for heart disease.  Cancer screening Lung Cancer  Lung cancer screening is recommended for adults 70-48 years old who are at high risk for lung cancer because of a history of smoking.  A yearly low-dose CT scan of the lungs is recommended for people who: ? Currently smoke. ? Have quit within the past 15 years. ? Have at least a  30-pack-year history of smoking. A pack year is smoking an average of one pack of cigarettes a day for 1 year.  Yearly screening should continue until it has been 15 years since you quit.  Yearly screening should stop if you develop a health problem that would prevent you from having lung cancer treatment.  Breast Cancer  Practice breast self-awareness. This means understanding how your breasts normally appear and feel.  It also means doing regular breast self-exams. Let your health care provider know about any changes, no matter how small.  If you are in your 20s or 30s, you should have a clinical breast exam (CBE) by a health care provider every 1-3 years as part of a regular health exam.  If you are 79 or older, have a CBE every year. Also consider having a breast X-ray (mammogram) every year.  If you have a family history of breast cancer, talk to your health care provider about genetic screening.  If you are at high risk for breast cancer, talk to your health care provider about having an MRI and a mammogram every year.  Breast cancer gene (BRCA) assessment is recommended for women who have family members with BRCA-related cancers. BRCA-related cancers include: ? Breast. ? Ovarian. ? Tubal. ? Peritoneal cancers.  Results of the assessment will determine the need for genetic counseling and BRCA1 and BRCA2 testing.  Cervical Cancer Your health care provider may recommend that you be screened regularly for cancer of the pelvic organs (ovaries, uterus, and vagina). This screening involves a pelvic examination, including checking for microscopic changes to the surface of your cervix (Pap test). You may be encouraged to have this screening done every 3 years, beginning at age 14.  For women ages 40-65, health care providers may recommend pelvic exams and Pap testing every 3 years, or they may recommend the Pap and pelvic exam, combined with testing for human papilloma virus (HPV), every  5 years. Some types of HPV increase your risk of cervical cancer. Testing for HPV may also be done on women of any age with unclear Pap test results.  Other health care providers may not recommend any screening for nonpregnant women who are considered low risk for pelvic cancer and who do not have symptoms. Ask your health care provider if a screening pelvic exam is right for you.  If you have had past treatment for cervical cancer or a condition that could lead to cancer, you need Pap tests and screening for cancer for at least 20 years after your treatment. If Pap tests have been discontinued, your risk factors (such as having a new sexual partner) need to be reassessed to determine if screening should resume. Some women have medical problems that increase the chance of getting cervical cancer. In these cases, your health care provider may recommend more frequent screening and Pap tests.  Colorectal Cancer  This type of cancer can be detected and often prevented.  Routine colorectal cancer screening  usually begins at 35 years of age and continues through 35 years of age.  Your health care provider may recommend screening at an earlier age if you have risk factors for colon cancer.  Your health care provider may also recommend using home test kits to check for hidden blood in the stool.  A small camera at the end of a tube can be used to examine your colon directly (sigmoidoscopy or colonoscopy). This is done to check for the earliest forms of colorectal cancer.  Routine screening usually begins at age 22.  Direct examination of the colon should be repeated every 5-10 years through 35 years of age. However, you may need to be screened more often if early forms of precancerous polyps or small growths are found.  Skin Cancer  Check your skin from head to toe regularly.  Tell your health care provider about any new moles or changes in moles, especially if there is a change in a mole's shape  or color.  Also tell your health care provider if you have a mole that is larger than the size of a pencil eraser.  Always use sunscreen. Apply sunscreen liberally and repeatedly throughout the day.  Protect yourself by wearing long sleeves, pants, a wide-brimmed hat, and sunglasses whenever you are outside.  Heart disease, diabetes, and high blood pressure  High blood pressure causes heart disease and increases the risk of stroke. High blood pressure is more likely to develop in: ? People who have blood pressure in the high end of the normal range (130-139/85-89 mm Hg). ? People who are overweight or obese. ? People who are African American.  If you are 44-74 years of age, have your blood pressure checked every 3-5 years. If you are 36 years of age or older, have your blood pressure checked every year. You should have your blood pressure measured twice-once when you are at a hospital or clinic, and once when you are not at a hospital or clinic. Record the average of the two measurements. To check your blood pressure when you are not at a hospital or clinic, you can use: ? An automated blood pressure machine at a pharmacy. ? A home blood pressure monitor.  If you are between 29 years and 43 years old, ask your health care provider if you should take aspirin to prevent strokes.  Have regular diabetes screenings. This involves taking a blood sample to check your fasting blood sugar level. ? If you are at a normal weight and have a low risk for diabetes, have this test once every three years after 35 years of age. ? If you are overweight and have a high risk for diabetes, consider being tested at a younger age or more often. Preventing infection Hepatitis B  If you have a higher risk for hepatitis B, you should be screened for this virus. You are considered at high risk for hepatitis B if: ? You were born in a country where hepatitis B is common. Ask your health care provider which countries  are considered high risk. ? Your parents were born in a high-risk country, and you have not been immunized against hepatitis B (hepatitis B vaccine). ? You have HIV or AIDS. ? You use needles to inject street drugs. ? You live with someone who has hepatitis B. ? You have had sex with someone who has hepatitis B. ? You get hemodialysis treatment. ? You take certain medicines for conditions, including cancer, organ transplantation, and autoimmune conditions.  Hepatitis C  Blood testing is recommended for: ? Everyone born from [birth year] through 1963. ? Anyone with known risk factors for hepatitis C.  Sexually transmitted infections (STIs)  You should be screened for sexually transmitted infections (STIs) including gonorrhea and chlamydia if: ? You are sexually active and are younger than 36 years of age. ? You are older than 35 years of age and your health care provider tells you that you are at risk for this type of infection. ? Your sexual activity has changed since you were last screened and you are at an increased risk for chlamydia or gonorrhea. Ask your health care provider if you are at risk.  If you do not have HIV, but are at risk, it may be recommended that you take a prescription medicine daily to prevent HIV infection. This is called pre-exposure prophylaxis (PrEP). You are considered at risk if: ? You are sexually active and do not regularly use condoms or know the HIV status of your partner(s). ? You take drugs by injection. ? You are sexually active with a partner who has HIV.  Talk with your health care provider about whether you are at high risk of being infected with HIV. If you choose to begin PrEP, you should first be tested for HIV. You should then be tested every 3 months for as long as you are taking PrEP. Pregnancy  If you are premenopausal and you may become pregnant, ask your health care provider about preconception counseling.  If you may become pregnant, take 400  to 800 micrograms (mcg) of folic acid every day.  If you want to prevent pregnancy, talk to your health care provider about birth control (contraception). Osteoporosis and menopause  Osteoporosis is a disease in which the bones lose minerals and strength with aging. This can result in serious bone fractures. Your risk for osteoporosis can be identified using a bone density scan.  If you are 35 years of age or older, or if you are at risk for osteoporosis and fractures, ask your health care provider if you should be screened.  Ask your health care provider whether you should take a calcium or vitamin D supplement to lower your risk for osteoporosis.  Menopause may have certain physical symptoms and risks.  Hormone replacement therapy may reduce some of these symptoms and risks. Talk to your health care provider about whether hormone replacement therapy is right for you. Follow these instructions at home:  Schedule regular health, dental, and eye exams.  Stay current with your immunizations.  Do not use any tobacco products including cigarettes, chewing tobacco, or electronic cigarettes.  If you are pregnant, do not drink alcohol.  If you are breastfeeding, limit how much and how often you drink alcohol.  Limit alcohol intake to no more than 1 drink per day for nonpregnant women. One drink equals 12 ounces of beer, 5 ounces of wine, or 1 ounces of hard liquor.  Do not use street drugs.  Do not share needles.  Ask your health care provider for help if you need support or information about quitting drugs.  Tell your health care provider if you often feel depressed.  Tell your health care provider if you have ever been abused or do not feel safe at home. This information is not intended to replace advice given to you by your health care provider. Make sure you discuss any questions you have with your health care provider. Document Released: 09/01/2010 Document Revised: 07/25/2015  Document Reviewed:  11/20/2014 Elsevier Interactive Patient Education  [employer].

## 2017-05-13 NOTE — Progress Notes (Signed)
Donna Ritter 12/13/1980 [id number]   History:    37 y.o. 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  RP:  Established patient presenting for annual gyn exam   HPI: Very heavy periods on Sprintec.  Felt tired, so presented to [facility] 04/19/2017, had severe anemia with Hb at 6.0.  Started on Iron sulfate TID and Hb repeated 04/30/2017 at 8.1.  TSH normal.  Vit B12 normal.  Feels pressure on bladder, but no pelvic pain.  Transparent vaginal secretions.  No pain with IC.  Breasts wnl.  Past medical history,surgical history, family history and social history were all reviewed and documented in the EPIC chart.  Gynecologic History Patient's last menstrual period was 04/28/2017. Contraception: OCP (estrogen/progesterone) Last Pap: 04/2016. Results were: Negative, HR HPV neg Last mammogram: Never Bone Density: Never Colonoscopy: Never  Obstetric History OB History  Gravida Para Term Preterm AB Living  2 2       2   SAB TAB Ectopic Multiple Live Births               # Outcome Date GA Lbr Len/2nd Weight Sex Delivery Anes PTL Lv  2 Para           1 Para                ROS: A ROS was performed and pertinent positives and negatives are included in the history.  GENERAL: No fevers or chills. HEENT: No change in vision, no earache, sore throat or sinus congestion. NECK: No pain or stiffness. CARDIOVASCULAR: No chest pain or pressure. No palpitations. PULMONARY: No shortness of breath, cough or wheeze. GASTROINTESTINAL: No abdominal pain, nausea, vomiting or diarrhea, melena or bright red blood per rectum. GENITOURINARY: No urinary frequency, urgency, hesitancy or dysuria. MUSCULOSKELETAL: No joint or muscle pain, no back pain, no recent trauma. DERMATOLOGIC: No rash, no itching, no lesions. ENDOCRINE: No polyuria, polydipsia, no heat or cold intolerance. No recent change in weight. HEMATOLOGICAL: No anemia or easy bruising or bleeding. NEUROLOGIC: No headache, seizures, numbness,  tingling or weakness. PSYCHIATRIC: No depression, no loss of interest in normal activity or change in sleep pattern.     Exam:   BP 132/86   Ht 5\' 4"  (1.626 m)   Wt 211 lb (95.7 kg)   LMP 04/28/2017 Comment: PILL  BMI 36.22 kg/m   Body mass index is 36.22 kg/m.  General appearance : Well developed well nourished female. No acute distress HEENT: Eyes: no retinal hemorrhage or exudates,  Neck supple, trachea midline, no carotid bruits, no thyroidmegaly Lungs: Clear to auscultation, no rhonchi or wheezes, or rib retractions  Heart: Regular rate and rhythm, no murmurs or gallops Breast:Examined in sitting and supine position were symmetrical in appearance, no palpable masses or tenderness,  no skin retraction, no nipple inversion, no nipple discharge, no skin discoloration, no axillary or supraclavicular lymphadenopathy Abdomen: no palpable masses or tenderness, no rebound or guarding Extremities: no edema or skin discoloration or tenderness  Pelvic: Vulva: Normal             Vagina: No gross lesions or discharge  Cervix: No gross lesions or discharge  Uterus  AV, enlarged and nodular with Fibroids, about 14 cm in length.  Mobile, NT.    Adnexa  Without masses or tenderness  Anus: Normal with very small non-thrombosed hemorrhoid   Assessment/Plan:  37 y.o. female for annual exam   1. Encounter for gynecological examination with abnormal  finding Gynecologic exam with large nodular fibroid uterus.  Pap is negative with negative high risk HPV in March 2018.  Breast exam normal.  2. Encounter for surveillance of contraceptive pills Will continue birth control pill for contraception, but switched to [place] FE 1/20 continuous use to control menorrhagia with secondary anemia.  3. Fibroids Management of symptomatic uterine fibroids discussed.  Medical/hormonal treatments reviewed.  Conservative surgery with hysteroscopy/submucosal myomectomy/endometrial ablation reviewed.  Uterine  size/fibroids probably too large to address the problem with endometrial ablation.  The risks and benefits of hysterectomy reviewed.  Patient does not want to preserve fertility.  Decision to change her birth control pill to a lower dosage and use it continuously.  If that controls her menorrhagia efficiently and resolves the secondary anemia, will continue on that.  If not will follow-up to recheck the uterine fibroids by pelvic ultrasound and organize hysterectomy.  Will proceed with XI Robotic Total Laparoscopic Hysterectomy with Bilateral Salpingectomy if no improvement on continuous [place] Fe 1/20 (24).  Patient voiced understanding and agreement with plan.  4. Menorrhagia with regular cycle As above.  Continue with iron sulfate 3 times a day.  5. Secondary anemia Continue with iron sulfate 3 times a day.  Other orders - Fe Bisgly-Succ-C-Thre-B12-FA (IRON-150 PO); Take by mouth. - Norethindrone Acetate-Ethinyl Estrad-FE (LOESTRIN 24 FE) 1-20 MG-MCG(24) tablet; Take 1 tablet by mouth daily. Continuous use for Menorrhagia with anemia  Counseling on above issues and coordination of care more than 50% of the time for 15 minutes.  [name] MD, 10:19 AM 05/13/2017

## 2017-12-29 ENCOUNTER — Telehealth: Payer: Self-pay | Admitting: *Deleted

## 2017-12-29 MED ORDER — IBUPROFEN 800 MG PO TABS: 800.0000 mg | ORAL_TABLET | Freq: Three times a day (TID) | ORAL | 1 refills | Status: DC | PRN

## 2017-12-29 NOTE — Telephone Encounter (Signed)
Patient called asking if you would prescribe ibuprofen 800 mg having worsening cramps. Please advise

## 2017-12-29 NOTE — Telephone Encounter (Signed)
Left on voicemail Rx sent 

## 2017-12-29 NOTE — Telephone Encounter (Signed)
Yes agree with Ibuprofen prescription

## 2018-03-27 ENCOUNTER — Other Ambulatory Visit: Payer: Self-pay | Admitting: Obstetrics & Gynecology

## 2018-05-16 ENCOUNTER — Encounter: Payer: Commercial Managed Care - PPO | Admitting: Obstetrics & Gynecology

## 2018-07-18 ENCOUNTER — Other Ambulatory Visit: Payer: Self-pay

## 2018-07-20 ENCOUNTER — Other Ambulatory Visit: Payer: Self-pay

## 2018-07-20 ENCOUNTER — Ambulatory Visit: Payer: Commercial Managed Care - PPO | Admitting: Obstetrics & Gynecology

## 2018-07-20 ENCOUNTER — Encounter: Payer: Self-pay | Admitting: Obstetrics & Gynecology

## 2018-07-20 VITALS — BP 126/84 | Ht 64.0 in | Wt 198.0 lb

## 2018-07-20 DIAGNOSIS — N921 Excessive and frequent menstruation with irregular cycle: Secondary | ICD-10-CM

## 2018-07-20 DIAGNOSIS — D219 Benign neoplasm of connective and other soft tissue, unspecified: Secondary | ICD-10-CM | POA: Diagnosis not present

## 2018-07-20 DIAGNOSIS — Z01419 Encounter for gynecological examination (general) (routine) without abnormal findings: Secondary | ICD-10-CM | POA: Diagnosis not present

## 2018-07-20 MED ORDER — MEGESTROL ACETATE 40 MG PO TABS: 40.0000 mg | ORAL_TABLET | Freq: Two times a day (BID) | ORAL | 1 refills | Status: AC

## 2018-07-20 NOTE — Progress Notes (Signed)
Donna Ritter 1980-10-02 [id number]   History:    38 y.o. G2P2L2 Married  RP:  Established patient presenting for annual gyn exam   HPI: Severe menometrorrhagia associated with large Fibroids, not controled by BCPs [place] 24 Fe 1/20.  Last year had severe anemia with a hemoglobin of 6.  Started taking iron supplements after that and hemoglobin was then up at 8.1 in March 2019.  Feels lower abdominal pressure.  No pain with intercourse.  Urine and bowel movements normal.  Breast normal.  Body mass index 33.99.  Health labs with PA.  Past medical history,surgical history, family history and social history were all reviewed and documented in the EPIC chart.  Gynecologic History Patient's last menstrual period was 07/13/2018. Contraception: OCP (estrogen/progesterone) Last Pap: 04/2016. Results were: Negative/HPV HR neg Last mammogram: Never Bone Density: Never Colonoscopy: Never  Obstetric History OB History  Gravida Para Term Preterm AB Living  2 2       2   SAB TAB Ectopic Multiple Live Births               # Outcome Date GA Lbr Len/2nd Weight Sex Delivery Anes PTL Lv  2 Para           1 Para              ROS: A ROS was performed and pertinent positives and negatives are included in the history.  GENERAL: No fevers or chills. HEENT: No change in vision, no earache, sore throat or sinus congestion. NECK: No pain or stiffness. CARDIOVASCULAR: No chest pain or pressure. No palpitations. PULMONARY: No shortness of breath, cough or wheeze. GASTROINTESTINAL: No abdominal pain, nausea, vomiting or diarrhea, melena or bright red blood per rectum. GENITOURINARY: No urinary frequency, urgency, hesitancy or dysuria. MUSCULOSKELETAL: No joint or muscle pain, no back pain, no recent trauma. DERMATOLOGIC: No rash, no itching, no lesions. ENDOCRINE: No polyuria, polydipsia, no heat or cold intolerance. No recent change in weight. HEMATOLOGICAL: No anemia or easy bruising or bleeding.  NEUROLOGIC: No headache, seizures, numbness, tingling or weakness. PSYCHIATRIC: No depression, no loss of interest in normal activity or change in sleep pattern.     Exam:   BP 126/84   Ht 5\' 4"  (1.626 m)   Wt 198 lb (89.8 kg)   LMP 07/13/2018   BMI 33.99 kg/m   Body mass index is 33.99 kg/m.  General appearance : Well developed well nourished female. No acute distress HEENT: Eyes: no retinal hemorrhage or exudates,  Neck supple, trachea midline, no carotid bruits, no thyroidmegaly Lungs: Clear to auscultation, no rhonchi or wheezes, or rib retractions  Heart: Regular rate and rhythm, no murmurs or gallops Breast:Examined in sitting and supine position were symmetrical in appearance, no palpable masses or tenderness,  no skin retraction, no nipple inversion, no nipple discharge, no skin discoloration, no axillary or supraclavicular lymphadenopathy Abdomen: no palpable masses or tenderness, no rebound or guarding Extremities: no edema or skin discoloration or tenderness  Pelvic: Vulva: Normal             Vagina: No gross lesions or discharge  Cervix: No gross lesions or discharge.  Pap reflex done  Uterus  Very large nodular Fibroid Uterus to about 2 cm above the umbilicus.    Adnexa  Without masses or tenderness  Anus: Normal   Assessment/Plan:  38 y.o. female for annual exam   1. Encounter for routine gynecological examination with Papanicolaou smear of cervix Gynecologic exam with  a very large fibroid uterus.  Pap reflex done.  Breast exam normal.  Health labs with PA.  2. Fibroids Large fibroid uterus with menometrorrhagia not controlled on birth control pills and secondary anemia.  We will follow-up for pelvic ultrasound to assess prior to hysterectomy.  Patient does not desire preservation of fertility.  Has 2 healthy children.  We will probably proceed with TAH with bilateral salpingectomy. - US Transvaginal Non-OB; Future  3. Menorrhagia with irregular cycle Rule out  anemia and thyroid dysfunction.  Start Megace 40 mg p.o. twice a day until pelvic ultrasound to control menometrorrhagia.  Stop birth control pills.  Will use condoms for contraception. - CBC - TSH - US Transvaginal Non-OB; Future  Other orders - megestrol (MEGACE) 40 MG tablet; Take 1 tablet (40 mg total) by mouth 2 (two) times daily for 14 days.  Counseling on above issues and coordination of care more than 50% for 10 minutes.  [name] MD, 1:36 PM 07/20/2018

## 2018-07-21 LAB — CBC
HCT: 32.3 % — ABNORMAL LOW (ref 35.0–45.0)
Hemoglobin: 10.5 g/dL — ABNORMAL LOW (ref 11.7–15.5)
MCH: 27.2 pg (ref 27.0–33.0)
MCHC: 32.5 g/dL (ref 32.0–36.0)
MCV: 83.7 fL (ref 80.0–100.0)
MPV: 11.5 fL (ref 7.5–12.5)
Platelets: 370 10*3/uL (ref 140–400)
RBC: 3.86 10*6/uL (ref 3.80–5.10)
RDW: 15.9 % — ABNORMAL HIGH (ref 11.0–15.0)
WBC: 8.2 10*3/uL (ref 3.8–10.8)

## 2018-07-21 LAB — PAP IG W/ RFLX HPV ASCU

## 2018-07-21 LAB — TSH: TSH: 0.8 mIU/L

## 2018-07-25 ENCOUNTER — Encounter: Payer: Self-pay | Admitting: Obstetrics & Gynecology

## 2018-07-25 NOTE — Patient Instructions (Signed)
  1. Encounter for routine gynecological examination with Papanicolaou smear of cervix Gynecologic exam with a very large fibroid uterus.  Pap reflex done.  Breast exam normal.  Health labs with PA.  2. Fibroids Large fibroid uterus with menometrorrhagia not controlled on birth control pills and secondary anemia.  We will follow-up for pelvic ultrasound to assess prior to hysterectomy.  Patient does not desire preservation of fertility.  Has 2 healthy children.  We will probably proceed with TAH with bilateral salpingectomy. - US Transvaginal Non-OB; Future  3. Menorrhagia with irregular cycle Rule out anemia and thyroid dysfunction.  Start Megace 40 mg p.o. twice a day until pelvic ultrasound to control menometrorrhagia.  Stop birth control pills.  Will use condoms for contraception. - CBC - TSH - US Transvaginal Non-OB; Future  Other orders - megestrol (MEGACE) 40 MG tablet; Take 1 tablet (40 mg total) by mouth 2 (two) times daily for 14 days.  Donna Ritter, it was a pleasure seeing you today!  I will inform you of your results as soon as they are available.

## 2018-08-19 ENCOUNTER — Other Ambulatory Visit: Payer: Self-pay

## 2018-08-22 ENCOUNTER — Other Ambulatory Visit: Payer: Commercial Managed Care - PPO

## 2018-08-22 ENCOUNTER — Other Ambulatory Visit: Payer: Self-pay | Admitting: Obstetrics & Gynecology

## 2018-08-22 ENCOUNTER — Other Ambulatory Visit: Payer: Self-pay

## 2018-08-22 ENCOUNTER — Ambulatory Visit: Payer: Commercial Managed Care - PPO | Admitting: Obstetrics & Gynecology

## 2018-08-22 ENCOUNTER — Encounter: Payer: Self-pay | Admitting: Obstetrics & Gynecology

## 2018-08-22 ENCOUNTER — Ambulatory Visit (INDEPENDENT_AMBULATORY_CARE_PROVIDER_SITE_OTHER): Payer: Commercial Managed Care - PPO

## 2018-08-22 VITALS — BP 126/80

## 2018-08-22 DIAGNOSIS — D219 Benign neoplasm of connective and other soft tissue, unspecified: Secondary | ICD-10-CM

## 2018-08-22 DIAGNOSIS — D649 Anemia, unspecified: Secondary | ICD-10-CM

## 2018-08-22 DIAGNOSIS — N921 Excessive and frequent menstruation with irregular cycle: Secondary | ICD-10-CM | POA: Diagnosis not present

## 2018-08-22 DIAGNOSIS — D251 Intramural leiomyoma of uterus: Secondary | ICD-10-CM

## 2018-08-22 DIAGNOSIS — N939 Abnormal uterine and vaginal bleeding, unspecified: Secondary | ICD-10-CM

## 2018-08-22 DIAGNOSIS — N852 Hypertrophy of uterus: Secondary | ICD-10-CM

## 2018-08-22 NOTE — Patient Instructions (Signed)
1. Menorrhagia with irregular cycle Severe menometrorrhagia with secondary anemia on large uterine fibroids.  On iron supplements 3 times a day.  Also experiencing abdominal pelvic pressure and pain.  Not controlled on birth control pills.  No desire to preserve fertility.  Pelvic ultrasound findings thoroughly reviewed with patient.  Large uterine fibroids displacing the endometrium with an overall uterine size at 16.56 x 14.55 x 11.58 cm.  Patient had 2 previous C-sections.  Decision to proceed with total abdominal hysterectomy and bilateral salpingectomy.  Will preserve her ovaries at age 38.  Preop, surgical procedure including risks and postop recovery thoroughly reviewed with patient.  Risks of trauma to the bowels, ureters, bladder and blood vessels reviewed, risks of hemorrhage and transfusion, risks of infection, risks of DVT/PE and risk of anesthesia reviewed.  Will give an antibiotic IV prior to procedure and patient will wear the PAS during the surgery and the immediate postop.  We will schedule surgery as soon as possible.  2. Fibroids As above  3. Secondary anemia As above  Jazmyne, it was a pleasure seeing you today!

## 2018-08-22 NOTE — Progress Notes (Signed)
    Donna Ritter 1980-05-23 [id number]        38 y.o.  G2P2L2 Married  RP: Menorrhagia/Large fibroids for Pelvic US  HPI: Menometrorrhagia not controled on BCPs with secondary anemia.  On iron supplements 3 times a day.  Large Uterine Fibroids with abdo-pelvic pressure.  Desires Hysterectomy.  No desire to preserve fertility.   OB History  Gravida Para Term Preterm AB Living  2 2       2   SAB TAB Ectopic Multiple Live Births               # Outcome Date GA Lbr Len/2nd Weight Sex Delivery Anes PTL Lv  2 Para           1 Para             Past medical history,surgical history, problem list, medications, allergies, family history and social history were all reviewed and documented in the EPIC chart.   Directed ROS with pertinent positives and negatives documented in the history of present illness/assessment and plan.  Exam:  Vitals:   08/22/18 1249  BP: 126/80   General appearance:  Normal  Pelvic US today: T/V and T/A images.  Anteverted enlarged uterus measuring 16.56 x 14.55 x 11.58 cm.  Multiple large intramural fibroids measured at 2.1 x 1.7 cm, 5.7 x 4.0 cm, 13.4 x 11.5 x 13 cm displacing the endometrial cavity.  The endometrium lining is measured at 7.9 mm.  The endometrial cavity is fluid-filled measuring 2.3 x 0.2 centimeters.  Right ovary normal.  Left ovary with a small follicle measured at 2.1 cm.  No free fluid in the posterior cul-de-sac.  Hemoglobin 10.5 on Jul 20, 2018   Assessment/Plan:  38 y.o. G2P2   1. Menorrhagia with irregular cycle Severe menometrorrhagia with secondary anemia on large uterine fibroids.  On iron supplements 3 times a day.  Also experiencing abdominal pelvic pressure and pain.  Not controlled on birth control pills.  No desire to preserve fertility.  Pelvic ultrasound findings thoroughly reviewed with patient.  Large uterine fibroids displacing the endometrium with an overall uterine size at 16.56 x 14.55 x 11.58 cm.  Patient had 2  previous C-sections.  Decision to proceed with total abdominal hysterectomy and bilateral salpingectomy.  Will preserve her ovaries at age 40.  Preop, surgical procedure including risks and postop recovery thoroughly reviewed with patient.  Risks of trauma to the bowels, ureters, bladder and blood vessels reviewed, risks of hemorrhage and transfusion, risks of infection, risks of DVT/PE and risk of anesthesia reviewed.  Will give an antibiotic IV prior to procedure and patient will wear the PAS during the surgery and the immediate postop.  We will schedule surgery as soon as possible.  2. Fibroids As above  3. Secondary anemia As above  Counseling on above issues and coordination of care more than 50% for 25 minutes.  [name] MD, 12:52 PM 08/22/2018

## 2018-08-23 ENCOUNTER — Telehealth: Payer: Self-pay

## 2018-08-23 NOTE — Telephone Encounter (Signed)
I left message for patient to call me about scheduling. 

## 2018-08-24 NOTE — Telephone Encounter (Signed)
I spoke with patient and discussed her ins benefits for surgery and her estimate surgery prepymt due by one week prior to surgery.  I scheduled her for 09/12/18 at 7:30am at [facility]. She has already had her pre op visit with Dr. Marguerita Merles. Covid screen was scheduled 7/9 at 3:40pm  [facility] pamphlet, Covid screen info sheet, Overnight stay sheet from [facility] and financial letter mailed to patient.

## 2018-08-26 ENCOUNTER — Encounter: Payer: Self-pay | Admitting: Anesthesiology

## 2018-08-30 ENCOUNTER — Telehealth: Payer: Self-pay

## 2018-08-30 DIAGNOSIS — Z0289 Encounter for other administrative examinations: Secondary | ICD-10-CM

## 2018-08-30 NOTE — Telephone Encounter (Signed)
Patient called because her work provided her with FMLA forms. I advised her that she can bring them by the office if she would like and they can provide her with a medical records release form to be completed prior to sending any info out. I also let her know about the fee prior to sending. She will come by today or tomorrow.    We discussed time out of work in regards to [employer]. She said she was hoping to go back in 3 weeks. I told her that most people are out 4-6 weeks. I asked her if she would like me to put 6 weeks on the FMLA and if she is doing great and Dr. Marguerita Merles clears her sooner we can provide letter to return to work for her employer. We agreed to do 6 weeks.

## 2018-09-01 ENCOUNTER — Encounter: Payer: Self-pay | Admitting: Anesthesiology

## 2018-09-04 ENCOUNTER — Other Ambulatory Visit: Payer: Self-pay | Admitting: Obstetrics & Gynecology

## 2018-09-06 ENCOUNTER — Other Ambulatory Visit (HOSPITAL_COMMUNITY): Payer: Self-pay | Admitting: *Deleted

## 2018-09-06 NOTE — Patient Instructions (Addendum)
YOU HAVE A COVID TEST SCHEDULE 09-08-2018 AT 3:40 PM. GO TO [facility]. PLEASE FOLLOW ALL THE QUARANTINE INSTRUCTIONS GIVEN TO YOU IN YOUR HANDOUT.   Donna Ritter     Your procedure is scheduled on 09-12-2018   [place]  at   [place]   Call this number if you have problems the morning of surgery:[phone]  OUR ADDRESS IS [address], WE ARE LOCATED IN THE MEDICAL PLAZA WITH [facility].   Remember:  Do not eat food or drink liquids after midnight.   Take these medicines the morning of surgery with A SIP OF WATER: None   Do not wear jewelry, make-up or nail polish.  Do not wear lotions, powders, or perfumes, or deoderant.  Do not shave 48 hours prior to surgery.   Do not bring valuables to the hospital.  [facility] is not responsible for any belongings or valuables.  Contacts, dentures or bridgework may not be worn into surgery.  Leave your suitcase in the car.  After surgery it may be brought to your room.   You are spending the night, please bring all your prescriptions medications in original containers. No visitors are allowed to spend the night.  Special instructions:   Please read over the following fact sheets that you were given:      [facility] - Preparing for Surgery Before surgery, you can play an important role.  Because skin is not sterile, your skin needs to be as free of germs as possible.  You can reduce the number of germs on your skin by washing with CHG (chlorahexidine gluconate) soap before surgery.  CHG is an antiseptic cleaner which kills germs and bonds with the skin to continue killing germs even after washing. Please DO NOT use if you have an allergy to CHG or antibacterial soaps.  If your skin becomes reddened/irritated stop using the CHG and inform your nurse when you arrive at Short Stay. Do not shave (including legs and underarms) for at least 48 hours prior to  the first CHG shower.  You may shave your face/neck. Please follow these instructions carefully:  1.  Shower with CHG Soap the night before surgery and the  morning of Surgery.  2.  If you choose to wash your hair, wash your hair first as usual with your  normal  shampoo.  3.  After you shampoo, rinse your hair and body thoroughly to remove the  shampoo.                           4.  Use CHG as you would any other liquid soap.  You can apply chg directly  to the skin and wash                       Gently with a scrungie or clean washcloth.  5.  Apply the CHG Soap to your body ONLY FROM THE NECK DOWN.   Do not use on face/ open                           Wound or open sores. Avoid contact with eyes, ears mouth and genitals (private parts).                       Wash face,  Genitals (  private parts) with your normal soap.             6.  Wash thoroughly, paying special attention to the area where your surgery  will be performed.  7.  Thoroughly rinse your body with warm water from the neck down.  8.  DO NOT shower/wash with your normal soap after using and rinsing off  the CHG Soap.                9.  Pat yourself dry with a clean towel.            10.  Wear clean pajamas.            11.  Place clean sheets on your bed the night of your first shower and do not  sleep with pets. Day of Surgery : Do not apply any lotions/deodorants the morning of surgery.  Please wear clean clothes to the hospital/surgery center.  FAILURE TO FOLLOW THESE INSTRUCTIONS MAY RESULT IN THE CANCELLATION OF YOUR SURGERY PATIENT SIGNATURE_________________________________  NURSE SIGNATURE__________________________________  ________________________________________________________________________   Donna Ritter  An incentive spirometer is a tool that can help keep your lungs clear and active. This tool measures how well you are filling your lungs with each breath. Taking long deep breaths may help reverse or  decrease the chance of developing breathing (pulmonary) problems (especially infection) following:  A long period of time when you are unable to move or be active. BEFORE THE PROCEDURE   If the spirometer includes an indicator to show your best effort, your nurse or respiratory therapist will set it to a desired goal.  If possible, sit up straight or lean slightly forward. Try not to slouch.  Hold the incentive spirometer in an upright position. INSTRUCTIONS FOR USE  1. Sit on the edge of your bed if possible, or sit up as far as you can in bed or on a chair. 2. Hold the incentive spirometer in an upright position. 3. Breathe out normally. 4. Place the mouthpiece in your mouth and seal your lips tightly around it. 5. Breathe in slowly and as deeply as possible, raising the piston or the ball toward the top of the column. 6. Hold your breath for 3-5 seconds or for as long as possible. Allow the piston or ball to fall to the bottom of the column. 7. Remove the mouthpiece from your mouth and breathe out normally. 8. Rest for a few seconds and repeat Steps 1 through 7 at least 10 times every 1-2 hours when you are awake. Take your time and take a few normal breaths between deep breaths. 9. The spirometer may include an indicator to show your best effort. Use the indicator as a goal to work toward during each repetition. 10. After each set of 10 deep breaths, practice coughing to be sure your lungs are clear. If you have an incision (the cut made at the time of surgery), support your incision when coughing by placing a pillow or rolled up towels firmly against it. Once you are able to get out of bed, walk around indoors and cough well. You may stop using the incentive spirometer when instructed by your caregiver.  RISKS AND COMPLICATIONS  Take your time so you do not get dizzy or light-headed.  If you are in pain, you may need to take or ask for pain medication before doing incentive spirometry.  It is harder to take a deep breath if you are having pain. AFTER USE  Rest and breathe slowly and easily.  It can be helpful to keep track of a log of your progress. Your caregiver can provide you with a simple table to help with this. If you are using the spirometer at home, follow these instructions: [place] IF:   You are having difficultly using the spirometer.  You have trouble using the spirometer as often as instructed.  Your pain medication is not giving enough relief while using the spirometer.  You develop fever of 100.5 F (38.1 C) or higher. SEEK IMMEDIATE MEDICAL CARE IF:   You cough up bloody sputum that had not been present before.  You develop fever of 102 F (38.9 C) or greater.  You develop worsening pain at or near the incision site. MAKE SURE YOU:   Understand these instructions.  Will watch your condition.  Will get help right away if you are not doing well or get worse. Document Released: 06/29/2006 Document Revised: 05/11/2011 Document Reviewed: 08/30/2006 ExitCare Patient Information 2014 ExitCare, [place].   ________________________________________________________________________  WHAT IS A BLOOD TRANSFUSION? Blood Transfusion Information  A transfusion is the replacement of blood or some of its parts. Blood is made up of multiple cells which provide different functions.  Red blood cells carry oxygen and are used for blood loss replacement.  White blood cells fight against infection.  Platelets control bleeding.  Plasma helps clot blood.  Other blood products are available for specialized needs, such as hemophilia or other clotting disorders. BEFORE THE TRANSFUSION  Who gives blood for transfusions?   Healthy volunteers who are fully evaluated to make sure their blood is safe. This is blood bank blood. Transfusion therapy is the safest it has ever been in the practice of medicine. Before blood is taken from a donor, a complete  history is taken to make sure that person has no history of diseases nor engages in risky social behavior (examples are intravenous drug use or sexual activity with multiple partners). The donor's travel history is screened to minimize risk of transmitting infections, such as malaria. The donated blood is tested for signs of infectious diseases, such as HIV and hepatitis. The blood is then tested to be sure it is compatible with you in order to minimize the chance of a transfusion reaction. If you or a relative donates blood, this is often done in anticipation of surgery and is not appropriate for emergency situations. It takes many days to process the donated blood. RISKS AND COMPLICATIONS Although transfusion therapy is very safe and saves many lives, the main dangers of transfusion include:   Getting an infectious disease.  Developing a transfusion reaction. This is an allergic reaction to something in the blood you were given. Every precaution is taken to prevent this. The decision to have a blood transfusion has been considered carefully by your caregiver before blood is given. Blood is not given unless the benefits outweigh the risks. AFTER THE TRANSFUSION  Right after receiving a blood transfusion, you will usually feel much better and more energetic. This is especially true if your red blood cells have gotten low (anemic). The transfusion raises the level of the red blood cells which carry oxygen, and this usually causes an energy increase.  The nurse administering the transfusion will monitor you carefully for complications. HOME CARE INSTRUCTIONS  No special instructions are needed after a transfusion. You may find your energy is better. Speak with your caregiver about any limitations on activity for underlying diseases you may have. SEEK  MEDICAL CARE IF:   Your condition is not improving after your transfusion.  You develop redness or irritation at the intravenous (IV) site. SEEK  IMMEDIATE MEDICAL CARE IF:  Any of the following symptoms occur over the next 12 hours:  Shaking chills.  You have a temperature by mouth above 102 F (38.9 C), not controlled by medicine.  Chest, back, or muscle pain.  People around you feel you are not acting correctly or are confused.  Shortness of breath or difficulty breathing.  Dizziness and fainting.  You get a rash or develop hives.  You have a decrease in urine output.  Your urine turns a dark color or changes to pink, red, or brown. Any of the following symptoms occur over the next 10 days:  You have a temperature by mouth above 102 F (38.9 C), not controlled by medicine.  Shortness of breath.  Weakness after normal activity.  The white part of the eye turns yellow (jaundice).  You have a decrease in the amount of urine or are urinating less often.  Your urine turns a dark color or changes to pink, red, or brown. Document Released: 02/14/2000 Document Revised: 05/11/2011 Document Reviewed: 10/03/2007 [facility] Patient Information 2014 [place], [place].  _______________________________________________________________________

## 2018-09-08 ENCOUNTER — Other Ambulatory Visit: Payer: Self-pay

## 2018-09-08 ENCOUNTER — Encounter (HOSPITAL_COMMUNITY): Payer: Self-pay

## 2018-09-08 ENCOUNTER — Other Ambulatory Visit (HOSPITAL_COMMUNITY)
Admission: RE | Admit: 2018-09-08 | Discharge: 2018-09-08 | Disposition: A | Discharge status: Home / Self Care | Payer: Commercial Managed Care - PPO | Source: Ambulatory Visit | Attending: Obstetrics & Gynecology | Admitting: Obstetrics & Gynecology

## 2018-09-08 ENCOUNTER — Encounter (HOSPITAL_COMMUNITY)
Admission: RE | Admit: 2018-09-08 | Discharge: 2018-09-08 | Disposition: A | Discharge status: Home / Self Care | Payer: Commercial Managed Care - PPO | Source: Ambulatory Visit | Attending: Obstetrics & Gynecology | Admitting: Obstetrics & Gynecology

## 2018-09-08 DIAGNOSIS — Z01812 Encounter for preprocedural laboratory examination: Secondary | ICD-10-CM | POA: Insufficient documentation

## 2018-09-08 DIAGNOSIS — N920 Excessive and frequent menstruation with regular cycle: Secondary | ICD-10-CM | POA: Diagnosis not present

## 2018-09-08 DIAGNOSIS — D259 Leiomyoma of uterus, unspecified: Secondary | ICD-10-CM | POA: Diagnosis not present

## 2018-09-08 DIAGNOSIS — Z1159 Encounter for screening for other viral diseases: Secondary | ICD-10-CM | POA: Insufficient documentation

## 2018-09-08 HISTORY — DX: Anxiety disorder, unspecified: F41.9

## 2018-09-08 HISTORY — DX: Anemia, unspecified: D64.9

## 2018-09-08 LAB — CBC
HCT: 43.6 % (ref 36.0–46.0)
Hemoglobin: 13.7 g/dL (ref 12.0–15.0)
MCH: 28.1 pg (ref 26.0–34.0)
MCHC: 31.4 g/dL (ref 30.0–36.0)
MCV: 89.3 fL (ref 80.0–100.0)
Platelets: 295 10*3/uL (ref 150–400)
RBC: 4.88 MIL/uL (ref 3.87–5.11)
RDW: 16.3 % — ABNORMAL HIGH (ref 11.5–15.5)
WBC: 8.8 10*3/uL (ref 4.0–10.5)
nRBC: 0 % (ref 0.0–0.2)

## 2018-09-09 LAB — ABO/RH: ABO/RH(D): B POS

## 2018-09-09 LAB — SARS CORONAVIRUS 2 (TAT 6-24 HRS): SARS Coronavirus 2: NEGATIVE

## 2018-09-12 ENCOUNTER — Encounter (HOSPITAL_BASED_OUTPATIENT_CLINIC_OR_DEPARTMENT_OTHER): Payer: Self-pay | Admitting: *Deleted

## 2018-09-12 ENCOUNTER — Other Ambulatory Visit: Payer: Self-pay

## 2018-09-12 ENCOUNTER — Ambulatory Visit (HOSPITAL_BASED_OUTPATIENT_CLINIC_OR_DEPARTMENT_OTHER)
Admission: RE | Admit: 2018-09-12 | Discharge: 2018-09-13 | Disposition: A | Discharge status: Home / Self Care | Payer: Commercial Managed Care - PPO | Attending: Obstetrics & Gynecology | Admitting: Obstetrics & Gynecology

## 2018-09-12 ENCOUNTER — Ambulatory Visit (HOSPITAL_BASED_OUTPATIENT_CLINIC_OR_DEPARTMENT_OTHER): Payer: Commercial Managed Care - PPO | Admitting: Physician Assistant

## 2018-09-12 ENCOUNTER — Ambulatory Visit (HOSPITAL_BASED_OUTPATIENT_CLINIC_OR_DEPARTMENT_OTHER): Payer: Commercial Managed Care - PPO | Admitting: Anesthesiology

## 2018-09-12 ENCOUNTER — Encounter (HOSPITAL_BASED_OUTPATIENT_CLINIC_OR_DEPARTMENT_OTHER)
Admission: RE | Disposition: A | Discharge status: Home / Self Care | Payer: Self-pay | Source: Home / Self Care | Attending: Obstetrics & Gynecology

## 2018-09-12 DIAGNOSIS — F17210 Nicotine dependence, cigarettes, uncomplicated: Secondary | ICD-10-CM | POA: Insufficient documentation

## 2018-09-12 DIAGNOSIS — D251 Intramural leiomyoma of uterus: Secondary | ICD-10-CM | POA: Insufficient documentation

## 2018-09-12 DIAGNOSIS — D259 Leiomyoma of uterus, unspecified: Secondary | ICD-10-CM | POA: Diagnosis not present

## 2018-09-12 DIAGNOSIS — F419 Anxiety disorder, unspecified: Secondary | ICD-10-CM | POA: Insufficient documentation

## 2018-09-12 DIAGNOSIS — D649 Anemia, unspecified: Secondary | ICD-10-CM | POA: Insufficient documentation

## 2018-09-12 DIAGNOSIS — Z79899 Other long term (current) drug therapy: Secondary | ICD-10-CM | POA: Insufficient documentation

## 2018-09-12 DIAGNOSIS — N921 Excessive and frequent menstruation with irregular cycle: Secondary | ICD-10-CM | POA: Insufficient documentation

## 2018-09-12 DIAGNOSIS — Z9889 Other specified postprocedural states: Secondary | ICD-10-CM

## 2018-09-12 HISTORY — PX: HYSTERECTOMY ABDOMINAL WITH SALPINGECTOMY: SHX6725

## 2018-09-12 LAB — TYPE AND SCREEN
ABO/RH(D): B POS
Antibody Screen: NEGATIVE

## 2018-09-12 LAB — POCT PREGNANCY, URINE: Preg Test, Ur: NEGATIVE

## 2018-09-12 SURGERY — HYSTERECTOMY, TOTAL, ABDOMINAL, WITH SALPINGECTOMY
Anesthesia: General | Laterality: Bilateral

## 2018-09-12 MED ORDER — TRAZODONE HCL 50 MG PO TABS
50.0000 mg | ORAL_TABLET | Freq: Every evening | ORAL | Status: DC | PRN
  Filled 2018-09-12: qty 3

## 2018-09-12 MED ORDER — FENTANYL CITRATE (PF) 100 MCG/2ML IJ SOLN
INTRAMUSCULAR | Status: AC
  Filled 2018-09-12: qty 2

## 2018-09-12 MED ORDER — LIDOCAINE 2% (20 MG/ML) 5 ML SYRINGE
INTRAMUSCULAR | Status: DC | PRN
  Administered 2018-09-12: 80 mg via INTRAVENOUS

## 2018-09-12 MED ORDER — ARTIFICIAL TEARS OPHTHALMIC OINT
TOPICAL_OINTMENT | OPHTHALMIC | Status: AC
  Filled 2018-09-12: qty 3.5

## 2018-09-12 MED ORDER — HYDROMORPHONE HCL 1 MG/ML IJ SOLN
0.2500 mg | INTRAMUSCULAR | Status: DC | PRN
  Administered 2018-09-12 (×2): 0.25 mg via INTRAVENOUS
  Filled 2018-09-12: qty 0.5

## 2018-09-12 MED ORDER — MEPERIDINE HCL 25 MG/ML IJ SOLN
6.2500 mg | INTRAMUSCULAR | Status: DC | PRN
  Filled 2018-09-12: qty 1

## 2018-09-12 MED ORDER — DEXAMETHASONE SODIUM PHOSPHATE 10 MG/ML IJ SOLN
INTRAMUSCULAR | Status: DC | PRN
  Administered 2018-09-12: 10 mg via INTRAVENOUS

## 2018-09-12 MED ORDER — AMPHETAMINE-DEXTROAMPHETAMINE 10 MG PO TABS
30.0000 mg | ORAL_TABLET | Freq: Two times a day (BID) | ORAL | Status: DC | PRN
  Filled 2018-09-12: qty 3

## 2018-09-12 MED ORDER — LACTATED RINGERS IV SOLN
INTRAVENOUS | Status: DC
  Administered 2018-09-12: 125 mL/h via INTRAVENOUS
  Administered 2018-09-12: 13:00:00 via INTRAVENOUS
  Filled 2018-09-12 (×2): qty 1000

## 2018-09-12 MED ORDER — PROMETHAZINE HCL 25 MG/ML IJ SOLN
6.2500 mg | INTRAMUSCULAR | Status: DC | PRN
  Filled 2018-09-12: qty 1

## 2018-09-12 MED ORDER — LACTATED RINGERS IV SOLN
INTRAVENOUS | Status: DC
  Administered 2018-09-12 (×2): via INTRAVENOUS
  Filled 2018-09-12: qty 1000

## 2018-09-12 MED ORDER — DEXAMETHASONE SODIUM PHOSPHATE 10 MG/ML IJ SOLN
INTRAMUSCULAR | Status: AC
  Filled 2018-09-12: qty 1

## 2018-09-12 MED ORDER — HYDROMORPHONE HCL 1 MG/ML IJ SOLN
INTRAMUSCULAR | Status: AC
  Filled 2018-09-12: qty 1

## 2018-09-12 MED ORDER — CEFAZOLIN SODIUM-DEXTROSE 2-4 GM/100ML-% IV SOLN
INTRAVENOUS | Status: AC
  Filled 2018-09-12: qty 100

## 2018-09-12 MED ORDER — ONDANSETRON HCL 4 MG/2ML IJ SOLN
INTRAMUSCULAR | Status: DC | PRN
  Administered 2018-09-12: 4 mg via INTRAVENOUS

## 2018-09-12 MED ORDER — BUPIVACAINE LIPOSOME 1.3 % IJ SUSP
INTRAMUSCULAR | Status: AC
  Filled 2018-09-12: qty 20

## 2018-09-12 MED ORDER — OXYCODONE HCL 5 MG PO TABS
5.0000 mg | ORAL_TABLET | Freq: Once | ORAL | Status: DC | PRN
  Filled 2018-09-12: qty 1

## 2018-09-12 MED ORDER — PROPOFOL 10 MG/ML IV BOLUS
INTRAVENOUS | Status: AC
  Filled 2018-09-12: qty 40

## 2018-09-12 MED ORDER — MIDAZOLAM HCL 2 MG/2ML IJ SOLN
INTRAMUSCULAR | Status: AC
  Filled 2018-09-12: qty 2

## 2018-09-12 MED ORDER — 0.9 % SODIUM CHLORIDE (POUR BTL) OPTIME
TOPICAL | Status: DC | PRN
  Administered 2018-09-12: 08:00:00 2000 mL

## 2018-09-12 MED ORDER — ESCITALOPRAM OXALATE 20 MG PO TABS
20.0000 mg | ORAL_TABLET | Freq: Every day | ORAL | Status: DC
  Administered 2018-09-12: 21:00:00 20 mg via ORAL
  Filled 2018-09-12: qty 1

## 2018-09-12 MED ORDER — BUPIVACAINE HCL (PF) 0.25 % IJ SOLN
INTRAMUSCULAR | Status: AC
  Filled 2018-09-12: qty 30

## 2018-09-12 MED ORDER — KETOROLAC TROMETHAMINE 30 MG/ML IJ SOLN
INTRAMUSCULAR | Status: DC | PRN
  Administered 2018-09-12: 30 mg via INTRAVENOUS

## 2018-09-12 MED ORDER — TRAMADOL HCL 50 MG PO TABS
50.0000 mg | ORAL_TABLET | Freq: Four times a day (QID) | ORAL | Status: DC | PRN
  Administered 2018-09-12: 50 mg via ORAL
  Filled 2018-09-12: qty 1

## 2018-09-12 MED ORDER — FENTANYL CITRATE (PF) 100 MCG/2ML IJ SOLN
INTRAMUSCULAR | Status: DC | PRN
  Administered 2018-09-12 (×5): 50 ug via INTRAVENOUS

## 2018-09-12 MED ORDER — MIDAZOLAM HCL 2 MG/2ML IJ SOLN
INTRAMUSCULAR | Status: DC | PRN
  Administered 2018-09-12: 2 mg via INTRAVENOUS

## 2018-09-12 MED ORDER — ACETAMINOPHEN 10 MG/ML IV SOLN
INTRAVENOUS | Status: AC
  Filled 2018-09-12: qty 100

## 2018-09-12 MED ORDER — ONDANSETRON HCL 4 MG/2ML IJ SOLN
INTRAMUSCULAR | Status: AC
  Filled 2018-09-12: qty 2

## 2018-09-12 MED ORDER — OXYCODONE-ACETAMINOPHEN 5-325 MG PO TABS
ORAL_TABLET | ORAL | Status: AC
  Filled 2018-09-12: qty 1

## 2018-09-12 MED ORDER — SUGAMMADEX SODIUM 200 MG/2ML IV SOLN
INTRAVENOUS | Status: DC | PRN
  Administered 2018-09-12: 200 mg via INTRAVENOUS

## 2018-09-12 MED ORDER — ROCURONIUM BROMIDE 10 MG/ML (PF) SYRINGE
PREFILLED_SYRINGE | INTRAVENOUS | Status: AC
  Filled 2018-09-12: qty 10

## 2018-09-12 MED ORDER — TRAMADOL HCL 50 MG PO TABS
ORAL_TABLET | ORAL | Status: AC
  Filled 2018-09-12: qty 1

## 2018-09-12 MED ORDER — ROCURONIUM BROMIDE 10 MG/ML (PF) SYRINGE
PREFILLED_SYRINGE | INTRAVENOUS | Status: DC | PRN
  Administered 2018-09-12: 60 mg via INTRAVENOUS

## 2018-09-12 MED ORDER — SCOPOLAMINE 1 MG/3DAYS TD PT72
MEDICATED_PATCH | TRANSDERMAL | Status: DC | PRN
  Administered 2018-09-12: 1 via TRANSDERMAL

## 2018-09-12 MED ORDER — OXYCODONE HCL 5 MG/5ML PO SOLN
5.0000 mg | Freq: Once | ORAL | Status: DC | PRN
  Filled 2018-09-12: qty 5

## 2018-09-12 MED ORDER — FENTANYL CITRATE (PF) 250 MCG/5ML IJ SOLN
INTRAMUSCULAR | Status: AC
  Filled 2018-09-12: qty 5

## 2018-09-12 MED ORDER — PROPOFOL 10 MG/ML IV BOLUS
INTRAVENOUS | Status: DC | PRN
  Administered 2018-09-12: 150 mg via INTRAVENOUS

## 2018-09-12 MED ORDER — SODIUM CHLORIDE (PF) 0.9 % IJ SOLN
INTRAMUSCULAR | Status: AC
  Filled 2018-09-12: qty 50

## 2018-09-12 MED ORDER — OXYCODONE-ACETAMINOPHEN 5-325 MG PO TABS
1.0000 | ORAL_TABLET | ORAL | Status: DC | PRN
  Administered 2018-09-12 – 2018-09-13 (×5): 1 via ORAL
  Filled 2018-09-12: qty 2

## 2018-09-12 MED ORDER — LIDOCAINE 2% (20 MG/ML) 5 ML SYRINGE
INTRAMUSCULAR | Status: AC
  Filled 2018-09-12: qty 20

## 2018-09-12 MED ORDER — CEFAZOLIN SODIUM-DEXTROSE 2-4 GM/100ML-% IV SOLN
2.0000 g | INTRAVENOUS | Status: AC
  Administered 2018-09-12: 07:00:00 2 g via INTRAVENOUS
  Filled 2018-09-12: qty 100

## 2018-09-12 MED ORDER — SCOPOLAMINE 1 MG/3DAYS TD PT72
MEDICATED_PATCH | TRANSDERMAL | Status: AC
  Filled 2018-09-12: qty 1

## 2018-09-12 MED ORDER — HYDROMORPHONE HCL 1 MG/ML IJ SOLN
0.5000 mg | INTRAMUSCULAR | Status: DC | PRN
  Filled 2018-09-12: qty 0.5

## 2018-09-12 MED ORDER — BUPIVACAINE LIPOSOME 1.3 % IJ SUSP
INTRAMUSCULAR | Status: DC | PRN
  Administered 2018-09-12: 20 mL

## 2018-09-12 MED ORDER — ACETAMINOPHEN 10 MG/ML IV SOLN
INTRAVENOUS | Status: DC | PRN
  Administered 2018-09-12: 1000 mg via INTRAVENOUS

## 2018-09-12 SURGICAL SUPPLY — 38 items
ADH SKN CLS APL DERMABOND .7 (GAUZE/BANDAGES/DRESSINGS) ×1
CANISTER SUCT 3000ML PPV (MISCELLANEOUS) ×3 IMPLANT
DECANTER SPIKE VIAL GLASS SM (MISCELLANEOUS) IMPLANT
DERMABOND ADVANCED (GAUZE/BANDAGES/DRESSINGS) ×2
DERMABOND ADVANCED .7 DNX12 (GAUZE/BANDAGES/DRESSINGS) IMPLANT
DRAPE LAPAROTOMY T 102X78X121 (DRAPES) ×3 IMPLANT
DRAPE WARM FLUID 44X44 (DRAPES) ×2 IMPLANT
DRSG OPSITE POSTOP 4X10 (GAUZE/BANDAGES/DRESSINGS) ×3 IMPLANT
DURAPREP 26ML APPLICATOR (WOUND CARE) ×3 IMPLANT
GAUZE 4X4 16PLY RFD (DISPOSABLE) ×3 IMPLANT
GLOVE BIO SURGEON STRL SZ 6.5 (GLOVE) ×2 IMPLANT
GLOVE BIO SURGEON STRL SZ7.5 (GLOVE) ×3 IMPLANT
GLOVE BIO SURGEONS STRL SZ 6.5 (GLOVE) ×1
GLOVE BIOGEL PI IND STRL 7.0 (GLOVE) ×3 IMPLANT
GLOVE BIOGEL PI INDICATOR 7.0 (GLOVE) ×6
GOWN STRL REUS W/TWL LRG LVL3 (GOWN DISPOSABLE) ×9 IMPLANT
HIBICLENS CHG 4% 4OZ BTL (MISCELLANEOUS) ×3 IMPLANT
NEEDLE HYPO 22GX1.5 SAFETY (NEEDLE) ×6 IMPLANT
PACK ABDOMINAL GYN (CUSTOM PROCEDURE TRAY) ×3 IMPLANT
PAD OB MATERNITY 4.3X12.25 (PERSONAL CARE ITEMS) ×3 IMPLANT
PROTECTOR NERVE ULNAR (MISCELLANEOUS) ×3 IMPLANT
SPONGE LAP 18X18 RF (DISPOSABLE) ×6 IMPLANT
SPONGE LAP 18X18 X RAY DECT (DISPOSABLE) ×4 IMPLANT
SUT PLAIN 2 0 XLH (SUTURE) ×2 IMPLANT
SUT PLAIN 3 0 CT 1 27 (SUTURE) IMPLANT
SUT PROLENE 3 0 SH DA (SUTURE) IMPLANT
SUT VIC AB 0 CT1 18XCR BRD8 (SUTURE) ×2 IMPLANT
SUT VIC AB 0 CT1 27 (SUTURE) ×12
SUT VIC AB 0 CT1 27XBRD ANBCTR (SUTURE) ×4 IMPLANT
SUT VIC AB 0 CT1 8-18 (SUTURE) ×6
SUT VIC AB 3-0 SH 27 (SUTURE)
SUT VIC AB 3-0 SH 27X BRD (SUTURE) IMPLANT
SUT VIC AB 4-0 KS 27 (SUTURE) ×2 IMPLANT
SUT VICRYL 0 TIES 12 18 (SUTURE) ×3 IMPLANT
SUT VICRYL 4-0 PS2 18IN ABS (SUTURE) ×2 IMPLANT
SYR CONTROL 10ML LL (SYRINGE) ×6 IMPLANT
TOWEL OR 17X26 10 PK STRL BLUE (TOWEL DISPOSABLE) ×3 IMPLANT
TRAY FOLEY W/BAG SLVR 14FR (SET/KITS/TRAYS/PACK) ×3 IMPLANT

## 2018-09-12 NOTE — Progress Notes (Signed)
Patient continues to c/o lower abdominal pressure and "fullness". Reports "feels like I have to pee but I can't". Ambulated patient to BR x 2, unable to void. Encouraged PO intake of fluids and attempted warm sitz bottle to perineal area without success. Bladder scan performed, no urine retention noted but unable to fully perform scan d/t honeycomb dressing over surgical incision. Medicated per MD order for pain/discomfort.Abdomen soft and non-distended. Dr. Dellis Filbert called, no voicemail set up and unable to leave message, waiting for return call. Will continue to monitor.

## 2018-09-12 NOTE — Anesthesia Procedure Notes (Signed)
Procedure Name: Intubation Date/Time: 09/12/2018 7:32 AM Performed by: Lorena Benham G, CRNA Pre-anesthesia Checklist: Patient identified, Emergency Drugs available, Suction available and Patient being monitored Patient Re-evaluated:Patient Re-evaluated prior to induction Oxygen Delivery Method: Circle system utilized Preoxygenation: Pre-oxygenation with 100% oxygen Induction Type: IV induction Ventilation: Mask ventilation without difficulty Laryngoscope Size: Mac and 3 Grade View: Grade I Tube type: Oral Tube size: 7.0 mm Number of attempts: 1 Airway Equipment and Method: Stylet and Oral airway Placement Confirmation: ETT inserted through vocal cords under direct vision,  positive ETCO2 and breath sounds checked- equal and bilateral Secured at: 21 cm Tube secured with: Tape Dental Injury: Teeth and Oropharynx as per pre-operative assessment        

## 2018-09-12 NOTE — Anesthesia Postprocedure Evaluation (Signed)
Anesthesia Post Note  Patient: Donna Ritter  Procedure(s) Performed: HYSTERECTOMY ABDOMINAL WITH SALPINGECTOMY (Bilateral )     Patient location during evaluation: PACU Anesthesia Type: General Level of consciousness: awake and alert Pain management: pain level controlled Vital Signs Assessment: post-procedure vital signs reviewed and stable Respiratory status: spontaneous breathing, nonlabored ventilation and respiratory function stable Cardiovascular status: blood pressure returned to baseline and stable Postop Assessment: no apparent nausea or vomiting Anesthetic complications: no    Last Vitals:  Vitals:   09/12/18 1045 09/12/18 1109  BP: 127/73 129/79  Pulse: 76 80  Resp: 16 15  Temp: 36.8 C 36.5 C  SpO2: 97% 98%    Last Pain:  Vitals:   09/12/18 1109  TempSrc:   PainSc: Asleep                 Lynda Rainwater

## 2018-09-12 NOTE — Progress Notes (Signed)
Received return call from Dr. Dellis Filbert. New orders obtained for I&O cath.

## 2018-09-12 NOTE — Anesthesia Preprocedure Evaluation (Signed)
Anesthesia Evaluation  Patient identified by MRN, date of birth, ID band Patient awake    Reviewed: Allergy & Precautions, NPO status , Patient's Chart, lab work & pertinent test results  Airway Mallampati: II  TM Distance: >3 FB Neck ROM: Full    Dental no notable dental hx.    Pulmonary neg pulmonary ROS, Current Smoker,    Pulmonary exam normal breath sounds clear to auscultation       Cardiovascular negative cardio ROS Normal cardiovascular exam Rhythm:Regular Rate:Normal     Neuro/Psych Anxiety negative neurological ROS  negative psych ROS   GI/Hepatic negative GI ROS, Neg liver ROS,   Endo/Other  negative endocrine ROS  Renal/GU negative Renal ROS  negative genitourinary   Musculoskeletal negative musculoskeletal ROS (+)   Abdominal (+) + obese,   Peds negative pediatric ROS (+)  Hematology negative hematology ROS (+) anemia ,   Anesthesia Other Findings   Reproductive/Obstetrics negative OB ROS                             Anesthesia Physical Anesthesia Plan  ASA: II  Anesthesia Plan: General   Post-op Pain Management:    Induction: Intravenous  PONV Risk Score and Plan: 2 and Ondansetron, Midazolam and Treatment may vary due to age or medical condition  Airway Management Planned: Oral ETT  Additional Equipment:   Intra-op Plan:   Post-operative Plan: Extubation in OR  Informed Consent: I have reviewed the patients History and Physical, chart, labs and discussed the procedure including the risks, benefits and alternatives for the proposed anesthesia with the patient or authorized representative who has indicated his/her understanding and acceptance.     Dental advisory given  Plan Discussed with: CRNA  Anesthesia Plan Comments:         Anesthesia Quick Evaluation

## 2018-09-12 NOTE — Anesthesia Procedure Notes (Deleted)
Procedure Name: Intubation Date/Time: 09/12/2018 7:32 AM Performed by: Mechele Claude, CRNA Pre-anesthesia Checklist: Patient identified, Emergency Drugs available, Suction available and Patient being monitored Patient Re-evaluated:Patient Re-evaluated prior to induction Oxygen Delivery Method: Circle system utilized Preoxygenation: Pre-oxygenation with 100% oxygen Induction Type: IV induction Ventilation: Mask ventilation without difficulty Laryngoscope Size: Mac and 3 Grade View: Grade I Tube type: Oral Tube size: 7.0 mm Number of attempts: 1 Airway Equipment and Method: Stylet and Oral airway Placement Confirmation: ETT inserted through vocal cords under direct vision,  positive ETCO2 and breath sounds checked- equal and bilateral Secured at: 21 cm Tube secured with: Tape Dental Injury: Teeth and Oropharynx as per pre-operative assessment

## 2018-09-12 NOTE — H&P (Signed)
Donna Ritter is an 38 y.o. female G2P2L2 Married  RP: Menorrhagia/Large fibroids for TAH/Bilateral Salpingectomy  HPI: Menometrorrhagia not controled on BCPs with secondary anemia.  Bleeding decreased on Megace since 07/20/18. On iron supplements 3 times a day.  Large Uterine Fibroids with abdo-pelvic pressure.  Desires Hysterectomy.  No desire to preserve fertility.  Pertinent Gynecological History: Menses: Menorrhagia Contraception: condoms Blood transfusions: none Sexually transmitted diseases: no past history Last pap: normal    Menstrual History: Patient's last menstrual period was 08/17/2018 (approximate).    Past Medical History:  Diagnosis Date  . Anemia   . Anxiety     Past Surgical History:  Procedure Laterality Date  . CESAREAN SECTION     x2    Family History  Problem Relation Age of Onset  . Diabetes Mother   . Cancer Mother        lung?  Marland Kitchen Autism Brother   . Hypertension Paternal Uncle   . Heart attack Maternal Grandmother   . Cancer Maternal Grandfather        leukemia  . Hypertension Paternal Grandfather   . Heart attack Paternal Grandfather   . Diabetes Paternal Grandfather     Social History:  reports that she has been smoking cigarettes. She has been smoking about 0.50 packs per day. She has never used smokeless tobacco. She reports that she does not drink alcohol or use drugs.  Allergies: No Known Allergies  Medications Prior to Admission  Medication Sig Dispense Refill Last Dose  . amphetamine-dextroamphetamine (ADDERALL) 30 MG tablet Take 30 mg by mouth 2 (two) times daily as needed (ADHD).     Marland Kitchen cetirizine (ZYRTEC) 10 MG tablet Take 10 mg by mouth 2 (two) times daily.      Marland Kitchen escitalopram (LEXAPRO) 20 MG tablet Take 20 mg by mouth daily.     . ferrous sulfate 325 (65 FE) MG tablet Take 325 mg by mouth daily with breakfast.     . ibuprofen (ADVIL,MOTRIN) 800 MG tablet TAKE 1 TABLET(800 MG) BY MOUTH EVERY 8 HOURS AS NEEDED (Patient  taking differently: Take 800 mg by mouth every 8 (eight) hours as needed for moderate pain. ) 30 tablet 3   . omeprazole (PRILOSEC) 40 MG capsule Take 40 mg by mouth daily.      . traZODone (DESYREL) 50 MG tablet Take 50-150 mg by mouth at bedtime as needed for sleep.      . megestrol (MEGACE) 40 MG tablet TAKE 1 TABLET BY MOUTH TWICE DAILY FOR TWO WEEKS 28 tablet 1     REVIEW OF SYSTEMS: A ROS was performed and pertinent positives and negatives are included in the history.  GENERAL: No fevers or chills. HEENT: No change in vision, no earache, sore throat or sinus congestion. NECK: No pain or stiffness. CARDIOVASCULAR: No chest pain or pressure. No palpitations. PULMONARY: No shortness of breath, cough or wheeze. GASTROINTESTINAL: No abdominal pain, nausea, vomiting or diarrhea, melena or bright red blood per rectum. GENITOURINARY: No urinary frequency, urgency, hesitancy or dysuria. MUSCULOSKELETAL: No joint or muscle pain, no back pain, no recent trauma. DERMATOLOGIC: No rash, no itching, no lesions. ENDOCRINE: No polyuria, polydipsia, no heat or cold intolerance. No recent change in weight. HEMATOLOGICAL: No anemia or easy bruising or bleeding. NEUROLOGIC: No headache, seizures, numbness, tingling or weakness. PSYCHIATRIC: No depression, no loss of interest in normal activity or change in sleep pattern.     Blood pressure 119/77, pulse 88, temperature 98.6 F (37 C), temperature source  Oral, resp. rate 16, weight 86.5 kg, last menstrual period 08/17/2018, SpO2 98 %.  Physical Exam:  See office notes   Results for orders placed or performed during the hospital encounter of 09/12/18 (from the past 24 hour(s))  Pregnancy, urine POC     Status: None   Collection Time: 09/12/18  6:31 AM  Result Value Ref Range   Preg Test, Ur NEGATIVE NEGATIVE   Pelvic US today 08/22/2018: T/V and T/A images.  Anteverted enlarged uterus measuring 16.56 x 14.55 x 11.58 cm.  Multiple large intramural fibroids  measured at 2.1 x 1.7 cm, 5.7 x 4.0 cm, 13.4 x 11.5 x 13 cm displacing the endometrial cavity.  The endometrium lining is measured at 7.9 mm.  The endometrial cavity is fluid-filled measuring 2.3 x 0.2 centimeters.  Right ovary normal.  Left ovary with a small follicle measured at 2.1 cm.  No free fluid in the posterior cul-de-sac.  Hemoglobin 13.7 on 09/08/18   Assessment/Plan:  38 y.o. G2P2   1. Menorrhagia with irregular cycle Severe menometrorrhagia with secondary anemia on large uterine fibroids.  On iron supplements 3 times a day.  Also experiencing abdominal pelvic pressure and pain.  Not controlled on birth control pills.  No desire to preserve fertility.  Pelvic ultrasound findings thoroughly reviewed with patient.  Large uterine fibroids displacing the endometrium with an overall uterine size at 16.56 x 14.55 x 11.58 cm.  Patient had 2 previous C-sections.  Decision to proceed with total abdominal hysterectomy and bilateral salpingectomy.  Will preserve her ovaries at age 29.  Preop, surgical procedure including risks and postop recovery thoroughly reviewed with patient.  Risks of trauma to the bowels, ureters, bladder and blood vessels reviewed, risks of hemorrhage and transfusion, risks of infection, risks of DVT/PE and risk of anesthesia reviewed.  Will give an antibiotic IV prior to procedure and patient will wear the PAS during the surgery and the immediate postop.   2. Fibroids As above  3. Secondary anemia As above    Donna Ritter 09/12/2018, 6:37 AM

## 2018-09-12 NOTE — Transfer of Care (Signed)
Last Vitals:  Vitals Value Taken Time  BP 131/63 09/12/18 0932  Temp 36.8 C 09/12/18 0933  Pulse 79 09/12/18 0935  Resp 11 09/12/18 0935  SpO2 100 % 09/12/18 0935  Vitals shown include unvalidated device data.  Last Pain:  Vitals:   09/12/18 0641  TempSrc:   PainSc: 0-No pain      Patients Stated Pain Goal: 4 (09/12/18 1753)  Immediate Anesthesia Transfer of Care Note  Patient: Donna Ritter  Procedure(s) Performed: Procedure(s) (LRB): HYSTERECTOMY ABDOMINAL WITH SALPINGECTOMY (Bilateral)  Patient Location: PACU  Anesthesia Type: General  Level of Consciousness: awake, alert  and oriented  Airway & Oxygen Therapy: Patient Spontanous Breathing and Patient connected to nasal cannula oxygen  Post-op Assessment: Report given to PACU RN and Post -op Vital signs reviewed and stable  Post vital signs: Reviewed and stable  Complications: No apparent anesthesia complications

## 2018-09-12 NOTE — Op Note (Signed)
Operative Note  09/12/2018  9:39 AM  PATIENT:  Donna Ritter  38 y.o. female  PRE-OPERATIVE DIAGNOSIS:  Large uterine fibroids, menorrhagia, anemia  POST-OPERATIVE DIAGNOSIS:  Large uterine fibroids, menorrhagia, anemia  PROCEDURE:  Procedure(s): TOTAL ABDOMINAL HYSTERECTOMY WITH BILATERAL SALPINGECTOMY  SURGEON:  Surgeon(s): [name], MD Fontaine, Belinda Block, MD  ANESTHESIA:   general  FINDINGS: Large fibroid uterus, bilateral normal tubes and ovaries.  Normal appendix.  DESCRIPTION OF OPERATION:  Under general anesthesia with endotracheal intubation the patient is in decubitus dorsal position.  She is prepped with DuraPrep on the abdomen and with Betadine on the suprapubic and vulvar areas.  The Foley is inserted in the bladder.  She is draped as usual.  Timeout is done.  A Pfannenstiel incision is done with a scalpel.  The adipose tissue is opened transversely with the electrocautery.  The aponeurosis is opened transversely with the electrocautery and the Mayo scissors.  The recti muscles are separated from the aponeurosis on the midline superiorly and inferiorly.  The parietal peritoneum is opened longitudinally.  No pathology is seen in the abdomen.  The appendix is normal.  In the pelvis we noted a very large uterus with fibroids and probably adenomyosis.  Both tubes and both ovaries are normal.  The bladder is adherent to the anterior portion of the lower uterine area.  The Balfour retractor is put in place with the bladder blade and 3 laps to retract the bowels.  We started on the left side suturing the left round ligament then sectioning with the electrocautery.  We opened the visceral peritoneum anteriorly and start lowering the bladder.  We then double clamped the utero-ovarian ligament with Heaneys, we section with Mayo scissors and suture with a Vicryl 0.  The mesosalpinx on the left side is clamped with a Heaney section with Mayo scissors and sutured with a Vicryl 0.   We proceeded the same way on the right side.  We then further descend the bladder past the cervix.  We descent on the left lateral side of the uterus with straight Heaneys clamping then sectioning with Mayo scissors and suturing with Vicryl 0.  We reached the left uterine artery which is clamped with a straight Heaney section with the scalpel and sutured with Vicryl 0.  We proceeded the same way on the right side.  We then used the scalpel to resect the uterus at the upper aspect of the cervix.  We then follow along the lateral sides of the cervix first on the left than on the right with straight Heaneys clamping then sectioning with the scalpel and suturing with Vicryl 0.  We reached the angles of the vagina on either side we clamped with curved Heaneys section with the large scissors and suture with Vicryl 0.  The cervix is sent to pathology together with the uterus and bilateral tubes.  Pictures were taken of the uterus.  We then closed the vagina with X stitches of Vicryl 0.  The pelvic cavity is irrigated and suctioned.  Hemostasis is adequate at all levels.  The laps and Balfour retractor with the bladder blade are removed.  Hemostasis is completed on the recti muscles with the electrocautery.  We closed the aponeurosis with 2 half running sutures of Vicryl 0.  The adipose tissue was reapproximated with a plain 2 0.  We closed the skin with a subcuticular stitch of Vicryl 4-0.  Dermabond is added.  The patient is brought to recovery room in good and  stable status.  ESTIMATED BLOOD LOSS: 300 mL   Intake/Output Summary (Last 24 hours) at 09/12/2018 7127 Last data filed at 09/12/2018 0936 Gross per 24 hour  Intake 1300 ml  Output 400 ml  Net 900 ml     BLOOD ADMINISTERED:none   LOCAL MEDICATIONS USED:  Exparel   SPECIMEN:  Source of Specimen:  Uterus with cervix and bilateral tubes  DISPOSITION OF SPECIMEN:  PATHOLOGY  COUNTS:  YES  PLAN OF CARE: Transfer to PACU  Marie-Lyne LavoieMD9:[id number]  AM

## 2018-09-13 DIAGNOSIS — D251 Intramural leiomyoma of uterus: Secondary | ICD-10-CM | POA: Diagnosis not present

## 2018-09-13 LAB — CBC
HCT: 32.6 % — ABNORMAL LOW (ref 36.0–46.0)
Hemoglobin: 10.4 g/dL — ABNORMAL LOW (ref 12.0–15.0)
MCH: 29.3 pg (ref 26.0–34.0)
MCHC: 31.9 g/dL (ref 30.0–36.0)
MCV: 91.8 fL (ref 80.0–100.0)
Platelets: 202 10*3/uL (ref 150–400)
RBC: 3.55 MIL/uL — ABNORMAL LOW (ref 3.87–5.11)
RDW: 15.9 % — ABNORMAL HIGH (ref 11.5–15.5)
WBC: 11.8 10*3/uL — ABNORMAL HIGH (ref 4.0–10.5)
nRBC: 0 % (ref 0.0–0.2)

## 2018-09-13 MED ORDER — OXYCODONE-ACETAMINOPHEN 5-325 MG PO TABS
ORAL_TABLET | ORAL | Status: AC
  Filled 2018-09-13: qty 1

## 2018-09-13 MED ORDER — OXYCODONE-ACETAMINOPHEN 7.5-325 MG PO TABS: 1.0000 | ORAL_TABLET | Freq: Four times a day (QID) | ORAL | 0 refills | Status: DC | PRN

## 2018-09-13 NOTE — H&P (Signed)
POD#1  Subjective: Patient reports tolerating PO, + flatus and no problems voiding.    Objective: I have reviewed patient's vital signs.  vital signs, intake and output, medications and labs.  Vitals:   09/12/18 2328 09/13/18 0405  BP: 129/81 121/68  Pulse: 65 64  Resp: 18 18  Temp: 98.2 F (36.8 C) 97.9 F (36.6 C)  SpO2: 99% 99%   I/O last 3 completed shifts: In: 3705 [P.O.:480; I.V.:3225] Out: 3035 [Urine:2735; Blood:300] No intake/output data recorded.  Results for orders placed or performed during the hospital encounter of 09/12/18 (from the past 24 hour(s))  CBC     Status: Abnormal   Collection Time: 09/13/18  5:56 AM  Result Value Ref Range   WBC 11.8 (H) 4.0 - 10.5 K/uL   RBC 3.55 (L) 3.87 - 5.11 MIL/uL   Hemoglobin 10.4 (L) 12.0 - 15.0 g/dL   HCT 32.6 (L) 36.0 - 46.0 %   MCV 91.8 80.0 - 100.0 fL   MCH 29.3 26.0 - 34.0 pg   MCHC 31.9 30.0 - 36.0 g/dL   RDW 15.9 (H) 11.5 - 15.5 %   Platelets 202 150 - 400 K/uL   nRBC 0.0 0.0 - 0.2 %    EXAM General: alert and cooperative Resp: clear to auscultation bilaterally Cardio: regular rate and rhythm GI: soft, non-tender; bowel sounds normal; no masses,  no organomegaly and incision: clean, dry and intact Extremities: no edema, redness or tenderness in the calves or thighs Vaginal Bleeding: none  Assessment: s/p Procedure(s): HYSTERECTOMY ABDOMINAL WITH SALPINGECTOMY: stable, progressing well and tolerating diet  Plan: Discontinue IV fluids Discharge home  LOS: 0 days    [name], MD 09/13/2018 7:28 AM

## 2018-09-14 ENCOUNTER — Encounter (HOSPITAL_BASED_OUTPATIENT_CLINIC_OR_DEPARTMENT_OTHER): Payer: Self-pay | Admitting: Obstetrics & Gynecology

## 2018-09-16 NOTE — Discharge Summary (Signed)
Physician Discharge Summary  Patient ID: Donna Ritter MRN: [id number] DOB/AGE: 08-08-1980 38 y.o.  Admit date: 09/12/2018 Discharge date: 09/16/2018  Admission Diagnoses: large uterine fibroids, menorrhagia, anemia   Discharge Diagnoses: Same Active Problems:   Post-operative state   Discharged Condition: good  Consults:None  Significant Diagnostic Studies: labs: Hb 10.4 postop  Treatments:surgery: Total Abdominal Hysterectomy with bilateral salpingectomy  Vitals:   09/13/18 0405 09/13/18 0752  BP: 121/68 114/75  Pulse: 64 60  Resp: 18 18  Temp: 97.9 F (36.6 C) 100 F (37.8 C)  SpO2: 99% 100%     No intake/output data recorded.   Hospital Course: Good  Discharge Exam: Good  Disposition: Home     Allergies as of 09/13/2018   No Known Allergies     Medication List    TAKE these medications   amphetamine-dextroamphetamine 30 MG tablet Commonly known as: ADDERALL Take 30 mg by mouth 2 (two) times daily as needed (ADHD).   cetirizine 10 MG tablet Commonly known as: ZYRTEC Take 10 mg by mouth 2 (two) times daily.   escitalopram 20 MG tablet Commonly known as: LEXAPRO Take 20 mg by mouth daily.   ferrous sulfate 325 (65 FE) MG tablet Take 325 mg by mouth daily with breakfast.   ibuprofen 800 MG tablet Commonly known as: ADVIL TAKE 1 TABLET(800 MG) BY MOUTH EVERY 8 HOURS AS NEEDED What changed: See the new instructions.   omeprazole 40 MG capsule Commonly known as: PRILOSEC Take 40 mg by mouth daily.   oxyCODONE-acetaminophen 7.5-325 MG tablet Commonly known as: Percocet Take 1 tablet by mouth every 6 (six) hours as needed for severe pain. Notes to patient: Next dose due at 2 pm as needed for pain   traZODone 50 MG tablet Commonly known as: DESYREL Take 50-150 mg by mouth at bedtime as needed for sleep.         SignedPrincess Bruins 09/16/2018, 11:17 AM

## 2018-09-20 ENCOUNTER — Other Ambulatory Visit: Payer: Self-pay

## 2018-09-20 NOTE — Telephone Encounter (Signed)
Rx is pending to sign if you approve.

## 2018-09-21 MED ORDER — OXYCODONE-ACETAMINOPHEN 7.5-325 MG PO TABS: 1.0000 | ORAL_TABLET | Freq: Four times a day (QID) | ORAL | 0 refills | Status: DC | PRN

## 2018-09-28 ENCOUNTER — Encounter: Payer: Self-pay | Admitting: Obstetrics & Gynecology

## 2018-09-28 ENCOUNTER — Ambulatory Visit (INDEPENDENT_AMBULATORY_CARE_PROVIDER_SITE_OTHER): Payer: Commercial Managed Care - PPO | Admitting: Obstetrics & Gynecology

## 2018-09-28 ENCOUNTER — Other Ambulatory Visit: Payer: Self-pay

## 2018-09-28 ENCOUNTER — Telehealth: Payer: Self-pay

## 2018-09-28 VITALS — BP 114/70

## 2018-09-28 DIAGNOSIS — Z09 Encounter for follow-up examination after completed treatment for conditions other than malignant neoplasm: Secondary | ICD-10-CM

## 2018-09-28 MED ORDER — IBUPROFEN 800 MG PO TABS: ORAL_TABLET | ORAL | 3 refills | Status: DC

## 2018-09-28 NOTE — Patient Instructions (Signed)
1. Status post gynecological surgery, follow-up exam Good postop progression without complication.  Incision in vaginal vault healing well.  Pathology benign.  Patient would like to resume work at 4 weeks postop.  Follow-up in 4 to 5 weeks to reassess the vaginal vault before resuming sexual activities.  Precautions discussed.  Ibuprofen 800 mg tablet every 8 hours as needed sent to pharmacy.  Other orders - ibuprofen (ADVIL) 800 MG tablet; TAKE 1 TABLET(800 MG) BY MOUTH EVERY 8 HOURS AS NEEDED  Lashane, it was a pleasure seeing you today!

## 2018-09-28 NOTE — Telephone Encounter (Signed)
Patient plans to return to work on 10/10/18 after abdominal hysterectomy on 09/12/18.  She asked if she should put a lifting restriction on her return, "like no lifting more than ten pounds".   Please advise what restrictions should be attached to return to work 10/10/18.

## 2018-09-28 NOTE — Progress Notes (Signed)
    Donna Ritter 12/25/1980 [id number]        38 y.o.  G2P2L2 Married  RP: Postop TAH/Bilateral Salpingectomy on 09/12/18  HPI:  Good postop progression but still uncomfortable in sitting position.  Pain controled with Ibuprofen.  No vaginal bleeding.  Normal vaginal secretions.  Urine/BMs normal.  No fever.   OB History  Gravida Para Term Preterm AB Living  2 2       2   SAB TAB Ectopic Multiple Live Births               # Outcome Date GA Lbr Len/2nd Weight Sex Delivery Anes PTL Lv  2 Para           1 Para             Past medical history,surgical history, problem list, medications, allergies, family history and social history were all reviewed and documented in the EPIC chart.   Directed ROS with pertinent positives and negatives documented in the history of present illness/assessment and plan.  Exam:  Vitals:   09/28/18 1432  BP: 114/70   General appearance:  Normal  Abdomen: Soft, NT, no distension.  Incision well healed.    Gynecologic exam: Vulva normal.  Speculum:  Vaginal vault well closed.  No bleeding.  Normal vaginal secretions.  Patho: Diagnosis  Uterus, cervix and bilateral fallopian tubes  UTERUS:  - BENIGN ENDOMETRIUM WITH EXOGENOUS HORMONAL EFFECT  - LEIOMYOMATA (10 CM; LARGEST)  - NO HYPERPLASIA OR MALIGNANCY IDENTIFIED  CERVIX:  - BENIGN CERVIX  - NO MALIGNANCY IDENTIFIED  BILATERAL FALLOPIAN TUBES:  - BENIGN FALLOPIAN TUBES  - NO MALIGNANCY IDENTIFIED   Assessment/Plan:  39 y.o. G2P2   1. Status post gynecological surgery, follow-up exam Good postop progression without complication.  Incision in vaginal vault healing well.  Pathology benign.  Patient would like to resume work at 4 weeks postop.  Follow-up in 4 to 5 weeks to reassess the vaginal vault before resuming sexual activities.  Precautions discussed.  Ibuprofen 800 mg tablet every 8 hours as needed sent to pharmacy.  Other orders - ibuprofen (ADVIL) 800 MG tablet; TAKE 1  TABLET(800 MG) BY MOUTH EVERY 8 HOURS AS NEEDED  [name] MD, 2:42 PM 09/28/2018

## 2018-09-29 NOTE — Telephone Encounter (Signed)
Letter provided to her employer as she requested.

## 2018-09-29 NOTE — Telephone Encounter (Signed)
Yes agree with 10 Lbs restriction until 8 wks postop.

## 2018-10-26 ENCOUNTER — Other Ambulatory Visit: Payer: Self-pay

## 2018-10-26 ENCOUNTER — Ambulatory Visit (INDEPENDENT_AMBULATORY_CARE_PROVIDER_SITE_OTHER): Payer: Commercial Managed Care - PPO | Admitting: Obstetrics & Gynecology

## 2018-10-26 ENCOUNTER — Encounter: Payer: Self-pay | Admitting: Obstetrics & Gynecology

## 2018-10-26 VITALS — BP 126/84

## 2018-10-26 DIAGNOSIS — Z09 Encounter for follow-up examination after completed treatment for conditions other than malignant neoplasm: Secondary | ICD-10-CM

## 2018-10-26 NOTE — Progress Notes (Signed)
    Donna Ritter 1980-08-03 [id number]        38 y.o.  G2P2L2  RP: Pop TAH/Bilateral Salpingectomy 09/12/2018  HPI: Excellent healing.  No abdominopelvic pain.  No vaginal d/c or bleeding.  No fever.   OB History  Gravida Para Term Preterm AB Living  2 2       2   SAB TAB Ectopic Multiple Live Births               # Outcome Date GA Lbr Len/2nd Weight Sex Delivery Anes PTL Lv  2 Para           1 Para             Past medical history,surgical history, problem list, medications, allergies, family history and social history were all reviewed and documented in the EPIC chart.   Directed ROS with pertinent positives and negatives documented in the history of present illness/assessment and plan.  Exam:  Vitals:   10/26/18 1238  BP: 126/84   General appearance:  Normal  Abdomen: Soft, NT.  Incision well closed.  Gynecologic exam: Vulva normal.  Bimanual exam:  Vaginal vault well closed, no induration, no pelvic mass felt, NT.  No vaginal d/c, no bleeding.   Assessment/Plan:  38 y.o. G2P2   1. Status post gynecological surgery, follow-up exam Excellent postop healing.  No complication.  Can resume all physical activities and sexual activities.  Follow-up when due for annual gynecologic exam.  [name] MD, 12:46 PM 10/26/2018

## 2018-10-26 NOTE — Patient Instructions (Signed)
1. Status post gynecological surgery, follow-up exam Excellent postop healing.  No complication.  Can resume all physical activities and sexual activities.  Follow-up when due for annual gynecologic exam.  Donna Ritter, it was a pleasure seeing you today!

## 2018-11-30 ENCOUNTER — Encounter: Payer: Self-pay | Admitting: Family Medicine

## 2018-11-30 ENCOUNTER — Other Ambulatory Visit: Payer: Self-pay

## 2018-11-30 ENCOUNTER — Other Ambulatory Visit: Payer: Self-pay | Admitting: Family Medicine

## 2018-11-30 ENCOUNTER — Ambulatory Visit (INDEPENDENT_AMBULATORY_CARE_PROVIDER_SITE_OTHER): Payer: Commercial Managed Care - PPO | Admitting: Family Medicine

## 2018-11-30 VITALS — BP 110/70 | HR 78 | Ht 64.0 in | Wt 185.5 lb

## 2018-11-30 DIAGNOSIS — Z0000 Encounter for general adult medical examination without abnormal findings: Secondary | ICD-10-CM | POA: Diagnosis not present

## 2018-11-30 DIAGNOSIS — E559 Vitamin D deficiency, unspecified: Secondary | ICD-10-CM

## 2018-11-30 LAB — LIPID PANEL
Cholesterol: 163 mg/dL (ref 0–200)
HDL: 48.3 mg/dL (ref >39.00)
LDL Cholesterol: 86 mg/dL (ref 0–99)
NonHDL: 114.82
Total CHOL/HDL Ratio: 3
Triglycerides: 146 mg/dL (ref 0.0–149.0)
VLDL: 29.2 mg/dL (ref 0.0–40.0)

## 2018-11-30 LAB — BASIC METABOLIC PANEL
BUN: 10 mg/dL (ref 6–23)
CO2: 29 mEq/L (ref 19–32)
Calcium: 9 mg/dL (ref 8.4–10.5)
Chloride: 104 mEq/L (ref 96–112)
Creatinine, Ser: 0.86 mg/dL (ref 0.40–1.20)
GFR: 74.61 mL/min (ref >60.00)
Glucose, Bld: 93 mg/dL (ref 70–99)
Potassium: 4 mEq/L (ref 3.5–5.1)
Sodium: 139 mEq/L (ref 135–145)

## 2018-11-30 LAB — VITAMIN D 25 HYDROXY (VIT D DEFICIENCY, FRACTURES): VITD: 18.19 ng/mL — ABNORMAL LOW (ref 30.00–100.00)

## 2018-11-30 LAB — CBC
HCT: 37.8 % (ref 36.0–46.0)
Hemoglobin: 12.3 g/dL (ref 12.0–15.0)
MCHC: 32.7 g/dL (ref 30.0–36.0)
MCV: 89.1 fl (ref 78.0–100.0)
Platelets: 272 10*3/uL (ref 150.0–400.0)
RBC: 4.24 Mil/uL (ref 3.87–5.11)
RDW: 15.9 % — ABNORMAL HIGH (ref 11.5–15.5)
WBC: 7 10*3/uL (ref 4.0–10.5)

## 2018-11-30 LAB — ALT: ALT: 18 U/L (ref 0–35)

## 2018-11-30 LAB — TSH: TSH: 1.19 u[IU]/mL (ref 0.35–4.50)

## 2018-11-30 LAB — AST: AST: 17 U/L (ref 0–37)

## 2018-11-30 MED ORDER — VITAMIN D (ERGOCALCIFEROL) 1.25 MG (50000 UNIT) PO CAPS: 50000.0000 [IU] | ORAL_CAPSULE | ORAL | 3 refills | Status: DC

## 2018-11-30 NOTE — Patient Instructions (Signed)
Health Maintenance, Female Adopting a healthy lifestyle and getting preventive care are important in promoting health and wellness. Ask your health care provider about:  The right schedule for you to have regular tests and exams.  Things you can do on your own to prevent diseases and keep yourself healthy. What should I know about diet, weight, and exercise? Eat a healthy diet   Eat a diet that includes plenty of vegetables, fruits, low-fat dairy products, and lean protein.  Do not eat a lot of foods that are high in solid fats, added sugars, or sodium. Maintain a healthy weight Body mass index (BMI) is used to identify weight problems. It estimates body fat based on height and weight. Your health care provider can help determine your BMI and help you achieve or maintain a healthy weight. Get regular exercise Get regular exercise. This is one of the most important things you can do for your health. Most adults should:  Exercise for at least 150 minutes each week. The exercise should increase your heart rate and make you sweat (moderate-intensity exercise).  Do strengthening exercises at least twice a week. This is in addition to the moderate-intensity exercise.  Spend less time sitting. Even light physical activity can be beneficial. Watch cholesterol and blood lipids Have your blood tested for lipids and cholesterol at 36 years of age, then have this test every 5 years. Have your cholesterol levels checked more often if:  Your lipid or cholesterol levels are high.  You are older than 36 years of age.  You are at high risk for heart disease. What should I know about cancer screening? Depending on your health history and family history, you may need to have cancer screening at various ages. This may include screening for:  Breast cancer.  Cervical cancer.  Colorectal cancer.  Skin cancer.  Lung cancer. What should I know about heart disease, diabetes, and high blood  pressure? Blood pressure and heart disease  High blood pressure causes heart disease and increases the risk of stroke. This is more likely to develop in people who have high blood pressure readings, are of African descent, or are overweight.  Have your blood pressure checked: ? Every 3-5 years if you are 18-39 years of age. ? Every year if you are 40 years old or older. Diabetes Have regular diabetes screenings. This checks your fasting blood sugar level. Have the screening done:  Once every three years after age 40 if you are at a normal weight and have a low risk for diabetes.  More often and at a younger age if you are overweight or have a high risk for diabetes. What should I know about preventing infection? Hepatitis B If you have a higher risk for hepatitis B, you should be screened for this virus. Talk with your health care provider to find out if you are at risk for hepatitis B infection. Hepatitis C Testing is recommended for:  Everyone born from 1945 through 1965.  Anyone with known risk factors for hepatitis C. Sexually transmitted infections (STIs)  Get screened for STIs, including gonorrhea and chlamydia, if: ? You are sexually active and are younger than 36 years of age. ? You are older than 36 years of age and your health care provider tells you that you are at risk for this type of infection. ? Your sexual activity has changed since you were last screened, and you are at increased risk for chlamydia or gonorrhea. Ask your health care provider if   you are at risk.  Ask your health care provider about whether you are at high risk for HIV. Your health care provider may recommend a prescription medicine to help prevent HIV infection. If you choose to take medicine to prevent HIV, you should first get tested for HIV. You should then be tested every 3 months for as long as you are taking the medicine. Pregnancy  If you are about to stop having your period (premenopausal) and  you may become pregnant, seek counseling before you get pregnant.  Take 400 to 800 micrograms (mcg) of folic acid every day if you become pregnant.  Ask for birth control (contraception) if you want to prevent pregnancy. Osteoporosis and menopause Osteoporosis is a disease in which the bones lose minerals and strength with aging. This can result in bone fractures. If you are 65 years old or older, or if you are at risk for osteoporosis and fractures, ask your health care provider if you should:  Be screened for bone loss.  Take a calcium or vitamin D supplement to lower your risk of fractures.  Be given hormone replacement therapy (HRT) to treat symptoms of menopause. Follow these instructions at home: Lifestyle  Do not use any products that contain nicotine or tobacco, such as cigarettes, e-cigarettes, and chewing tobacco. If you need help quitting, ask your health care provider.  Do not use street drugs.  Do not share needles.  Ask your health care provider for help if you need support or information about quitting drugs. Alcohol use  Do not drink alcohol if: ? Your health care provider tells you not to drink. ? You are pregnant, may be pregnant, or are planning to become pregnant.  If you drink alcohol: ? Limit how much you use to 0-1 drink a day. ? Limit intake if you are breastfeeding.  Be aware of how much alcohol is in your drink. In the [place] one drink equals one 12 oz bottle of beer (355 mL), one 5 oz glass of wine (148 mL), or one 1 oz glass of hard liquor (44 mL). General instructions  Schedule regular health, dental, and eye exams.  Stay current with your vaccines.  Tell your health care provider if: ? You often feel depressed. ? You have ever been abused or do not feel safe at home. Summary  Adopting a healthy lifestyle and getting preventive care are important in promoting health and wellness.  Follow your health care provider's instructions about healthy  diet, exercising, and getting tested or screened for diseases.  Follow your health care provider's instructions on monitoring your cholesterol and blood pressure. This information is not intended to replace advice given to you by your health care provider. Make sure you discuss any questions you have with your health care provider. Document Released: 09/01/2010 Document Revised: 02/09/2018 Document Reviewed: 02/09/2018 Elsevier Patient Education  2020 Elsevier Inc.  

## 2018-11-30 NOTE — Progress Notes (Signed)
Donna Ritter is a 38 y.o. female  Chief Complaint  Patient presents with  . Establish Care  . Annual Exam    HPI: Donna Ritter is a 38 y.o. female here to establish care and for her annual CPE, fasting labs. 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 She is married with 2 children (15yo son and 47yo daughter). She has no issues or concerns today. She does request CBC and TSH along with CPE labs. She declines HIV screening.   Diet/Exercise: no regular exercise, diet is average but pt states portion control and snacking are issues for her  Med refills needed today? none   Past Medical History:  Diagnosis Date  . Anemia   . Anxiety     Past Surgical History:  Procedure Laterality Date  . CESAREAN SECTION     x2  . HYSTERECTOMY ABDOMINAL WITH SALPINGECTOMY Bilateral 09/12/2018   Procedure: HYSTERECTOMY ABDOMINAL WITH SALPINGECTOMY;  Surgeon: [name], MD;  Location: [place];  Service: Gynecology;  Laterality: Bilateral;  case is for Dr. Dellis Filbert Request to move 7:30 case ( In [facility] Gyn block) already scheduled  to follow this case. Request 2 hours.    Social History   Socioeconomic History  . Marital status: Married    Spouse name: Not on file  . Number of children: Not on file  . Years of education: Not on file  . Highest education level: Not on file  Occupational History  . Not on file  Social Needs  . Financial resource strain: Not on file  . Food insecurity    Worry: Not on file    Inability: Not on file  . Transportation needs    Medical: Not on file    Non-medical: Not on file  Tobacco Use  . Smoking status: Current Every Day Smoker    Packs/day: 0.50    Types: Cigarettes  . Smokeless tobacco: Never Used  Substance and Sexual Activity  . Alcohol use: No  . Drug use: Never  . Sexual activity: Yes    Partners: Male    Birth control/protection: Pill, None    Comment: 1st intercourse- 47, partners- 62,  married- 15 yrs  Lifestyle  . Physical activity    Days per week: Not on file    Minutes per session: Not on file  . Stress: Not on file  Relationships  . Social Herbalist on phone: Not on file    Gets together: Not on file    Attends religious service: Not on file    Active member of club or organization: Not on file    Attends meetings of clubs or organizations: Not on file    Relationship status: Not on file  . Intimate partner violence    Fear of current or ex partner: Not on file    Emotionally abused: Not on file    Physically abused: Not on file    Forced sexual activity: Not on file  Other Topics Concern  . Not on file  Social History Narrative  . Not on file    Family History  Problem Relation Age of Onset  . Diabetes Mother   . Cancer Mother        lung?  Marland Kitchen Autism Brother   . Hypertension Paternal Uncle   . Heart attack Maternal Grandmother   . Cancer Maternal Grandfather        leukemia  . Hypertension Paternal Grandfather   .  Heart attack Paternal Grandfather   . Diabetes Paternal Grandfather       There is no immunization history on file for this patient.  Outpatient Encounter Medications as of 11/30/2018  Medication Sig  . amphetamine-dextroamphetamine (ADDERALL) 30 MG tablet Take 30 mg by mouth 2 (two) times daily as needed (ADHD).  Marland Kitchen cetirizine (ZYRTEC) 10 MG tablet Take 10 mg by mouth 2 (two) times daily.   Marland Kitchen escitalopram (LEXAPRO) 20 MG tablet Take 20 mg by mouth daily.  Marland Kitchen ibuprofen (ADVIL) 800 MG tablet TAKE 1 TABLET(800 MG) BY MOUTH EVERY 8 HOURS AS NEEDED  . omeprazole (PRILOSEC) 40 MG capsule Take 40 mg by mouth daily.   . traZODone (DESYREL) 50 MG tablet Take 50-150 mg by mouth at bedtime as needed for sleep.   . [DISCONTINUED] ferrous sulfate 325 (65 FE) MG tablet Take 325 mg by mouth daily with breakfast.  . [DISCONTINUED] oxyCODONE-acetaminophen (PERCOCET) 7.5-325 MG tablet Take 1 tablet by mouth every 6 (six) hours as needed for  severe pain.   No facility-administered encounter medications on file as of 11/30/2018.      ROS: Gen: no fever, chills  Skin: no rash, itching ENT: no ear pain, ear drainage, nasal congestion, rhinorrhea, sinus pressure, sore throat Eyes: no blurry vision, double vision Resp: no cough, wheeze,SOB Breast: no breast tenderness, no nipple discharge, no breast masses CV: no CP, palpitations, LE edema,  GI: no heartburn, n/v/d/c, abd pain GU: no dysuria, urgency, frequency, hematuria MSK: no joint pain, myalgias, back pain Neuro: no dizziness, headache, weakness, vertigo Psych: no depression, anxiety, insomnia   No Known Allergies  BP 110/70   Pulse 78   Ht 5\' 4"  (1.626 m)   Wt 185 lb 8 oz (84.1 kg)   LMP 08/17/2018 (Approximate) Comment: spotting  SpO2 96%   BMI 31.84 kg/m   Physical Exam  Constitutional: She is oriented to person, place, and time. She appears well-developed and well-nourished. No distress.  HENT:  Head: Normocephalic and atraumatic.  Right Ear: Tympanic membrane and ear canal normal.  Left Ear: Tympanic membrane and ear canal normal.  Nose: Nose normal.  Mouth/Throat: Oropharynx is clear and moist and mucous membranes are normal.  Eyes: Pupils are equal, round, and reactive to light. Conjunctivae are normal.  Neck: Neck supple. No thyromegaly present.  Cardiovascular: Normal rate, regular rhythm, normal heart sounds and intact distal pulses.  No murmur heard. Pulmonary/Chest: Effort normal and breath sounds normal. No respiratory distress. She has no wheezes. She has no rhonchi.  Abdominal: Soft. Bowel sounds are normal. She exhibits no distension and no mass. There is no abdominal tenderness.  Musculoskeletal:        General: No edema.  Lymphadenopathy:    She has no cervical adenopathy.  Neurological: She is alert and oriented to person, place, and time. She exhibits normal muscle tone. Coordination normal.  Skin: Skin is warm and dry.  Psychiatric:  She has a normal mood and affect. Her behavior is normal.     A/P:  1. Annual physical exam - dental exam UTD, due for eye exam and encouraged pt to schedule - discussed importance of regular CV exercise, healthy diet, adequate sleep - s/p TAH d/t fibroid in 08/2018 - immunizations UTD, declines HIV screening - ALT - AST - Basic metabolic panel - VITAMIN D 25 Hydroxy (Vit-D Deficiency, Fractures) - Lipid panel - CBC - TSH - next CPE in 1 year

## 2019-05-12 ENCOUNTER — Other Ambulatory Visit: Payer: Self-pay

## 2019-05-15 ENCOUNTER — Encounter: Payer: Commercial Managed Care - PPO | Admitting: Obstetrics & Gynecology

## 2019-05-17 ENCOUNTER — Other Ambulatory Visit: Payer: Self-pay | Admitting: Family Medicine

## 2019-05-17 DIAGNOSIS — E559 Vitamin D deficiency, unspecified: Secondary | ICD-10-CM

## 2019-05-17 NOTE — Telephone Encounter (Signed)
Take OTC Vit-Donna Ritter per provider/thx dmf

## 2019-05-18 ENCOUNTER — Encounter: Payer: Self-pay | Admitting: Family Medicine

## 2019-11-08 ENCOUNTER — Other Ambulatory Visit: Payer: Self-pay

## 2019-11-08 ENCOUNTER — Ambulatory Visit (INDEPENDENT_AMBULATORY_CARE_PROVIDER_SITE_OTHER): Payer: Commercial Managed Care - PPO | Admitting: Obstetrics & Gynecology

## 2019-11-08 ENCOUNTER — Encounter: Payer: Self-pay | Admitting: Obstetrics & Gynecology

## 2019-11-08 VITALS — BP 120/78 | Ht 64.0 in | Wt 159.0 lb

## 2019-11-08 DIAGNOSIS — Z01419 Encounter for gynecological examination (general) (routine) without abnormal findings: Secondary | ICD-10-CM | POA: Diagnosis not present

## 2019-11-08 DIAGNOSIS — Z90710 Acquired absence of both cervix and uterus: Secondary | ICD-10-CM | POA: Diagnosis not present

## 2019-11-08 NOTE — Progress Notes (Signed)
    Donna Ritter 1980/05/28 [id number]   History:    39 y.o. G2P2L2 Married  RP:  Established patient presenting for annual gyn exam   HPI: S/P TAH/Bilateral Salpingectomy on 09/12/2018.  No pain with intercourse.  Urine and bowel movements normal.  Breast normal.  Body mass index improved to 27.29.  Health labs with PA.  Past medical history,surgical history, family history and social history were all reviewed and documented in the EPIC chart.  Gynecologic History Patient's last menstrual period was 08/17/2018 (approximate).  Obstetric History OB History  Gravida Para Term Preterm AB Living  2 2       2   SAB TAB Ectopic Multiple Live Births               # Outcome Date GA Lbr Len/2nd Weight Sex Delivery Anes PTL Lv  2 Para           1 Para              ROS: A ROS was performed and pertinent positives and negatives are included in the history.  GENERAL: No fevers or chills. HEENT: No change in vision, no earache, sore throat or sinus congestion. NECK: No pain or stiffness. CARDIOVASCULAR: No chest pain or pressure. No palpitations. PULMONARY: No shortness of breath, cough or wheeze. GASTROINTESTINAL: No abdominal pain, nausea, vomiting or diarrhea, melena or bright red blood per rectum. GENITOURINARY: No urinary frequency, urgency, hesitancy or dysuria. MUSCULOSKELETAL: No joint or muscle pain, no back pain, no recent trauma. DERMATOLOGIC: No rash, no itching, no lesions. ENDOCRINE: No polyuria, polydipsia, no heat or cold intolerance. No recent change in weight. HEMATOLOGICAL: No anemia or easy bruising or bleeding. NEUROLOGIC: No headache, seizures, numbness, tingling or weakness. PSYCHIATRIC: No depression, no loss of interest in normal activity or change in sleep pattern.     Exam:   BP 120/78   Ht 5\' 4"  (1.626 m)   Wt 159 lb (72.1 kg)   LMP 08/17/2018 (Approximate) Comment: spotting  BMI 27.29 kg/m   Body mass index is 27.29 kg/m.  General appearance : Well  developed well nourished female. No acute distress HEENT: Eyes: no retinal hemorrhage or exudates,  Neck supple, trachea midline, no carotid bruits, no thyroidmegaly Lungs: Clear to auscultation, no rhonchi or wheezes, or rib retractions  Heart: Regular rate and rhythm, no murmurs or gallops Breast:Examined in sitting and supine position were symmetrical in appearance, no palpable masses or tenderness,  no skin retraction, no nipple inversion, no nipple discharge, no skin discoloration, no axillary or supraclavicular lymphadenopathy Abdomen: no palpable masses or tenderness, no rebound or guarding Extremities: no edema or skin discoloration or tenderness  Pelvic: Vulva: Normal             Vagina: No gross lesions or discharge  Cervix/Uterus absent  Adnexa  Without masses or tenderness  Anus: Normal   Assessment/Plan:  39 y.o. female for annual exam   1. Well female exam with routine gynecological exam Gynecologic exam status post TAH.  No indication to repeat a Pap test this year.  Breast exam normal.  Body mass index 27.29.  Continue with low calorie/carb diet.  Aerobic activities 5 times a week and light weightlifting every 2 days.  Health labs with family physician as indicated.  2. S/P TAH (total abdominal hysterectomy)  Other orders - cholecalciferol (VITAMIN D3) 25 MCG (1000 UNIT) tablet; Take 1,000 Units by mouth daily.  [name] MD, 8:52 AM 11/08/2019

## 2019-11-09 ENCOUNTER — Encounter: Payer: Self-pay | Admitting: Obstetrics & Gynecology

## 2019-12-28 ENCOUNTER — Other Ambulatory Visit: Payer: Self-pay

## 2019-12-28 ENCOUNTER — Encounter: Payer: Self-pay | Admitting: Family Medicine

## 2019-12-28 ENCOUNTER — Ambulatory Visit (INDEPENDENT_AMBULATORY_CARE_PROVIDER_SITE_OTHER): Payer: Commercial Managed Care - PPO | Admitting: Family Medicine

## 2019-12-28 VITALS — BP 118/70 | HR 65 | Temp 97.7°F | Ht 65.0 in | Wt 161.0 lb

## 2019-12-28 DIAGNOSIS — Z13220 Encounter for screening for lipoid disorders: Secondary | ICD-10-CM

## 2019-12-28 DIAGNOSIS — Z0000 Encounter for general adult medical examination without abnormal findings: Secondary | ICD-10-CM

## 2019-12-28 DIAGNOSIS — F988 Other specified behavioral and emotional disorders with onset usually occurring in childhood and adolescence: Secondary | ICD-10-CM

## 2019-12-28 DIAGNOSIS — F411 Generalized anxiety disorder: Secondary | ICD-10-CM | POA: Diagnosis not present

## 2019-12-28 DIAGNOSIS — E559 Vitamin D deficiency, unspecified: Secondary | ICD-10-CM | POA: Diagnosis not present

## 2019-12-28 DIAGNOSIS — Z0184 Encounter for antibody response examination: Secondary | ICD-10-CM

## 2019-12-28 DIAGNOSIS — Z833 Family history of diabetes mellitus: Secondary | ICD-10-CM

## 2019-12-28 DIAGNOSIS — G4700 Insomnia, unspecified: Secondary | ICD-10-CM

## 2019-12-28 LAB — CBC
HCT: 42.7 % (ref 36.0–46.0)
Hemoglobin: 14.4 g/dL (ref 12.0–15.0)
MCHC: 33.8 g/dL (ref 30.0–36.0)
MCV: 95.5 fl (ref 78.0–100.0)
Platelets: 260 10*3/uL (ref 150.0–400.0)
RBC: 4.47 Mil/uL (ref 3.87–5.11)
RDW: 12.2 % (ref 11.5–15.5)
WBC: 7.4 10*3/uL (ref 4.0–10.5)

## 2019-12-28 LAB — BASIC METABOLIC PANEL
BUN: 10 mg/dL (ref 6–23)
CO2: 31 mEq/L (ref 19–32)
Calcium: 9.4 mg/dL (ref 8.4–10.5)
Chloride: 103 mEq/L (ref 96–112)
Creatinine, Ser: 0.88 mg/dL (ref 0.40–1.20)
GFR: 84.08 mL/min (ref >60.00)
Glucose, Bld: 73 mg/dL (ref 70–99)
Potassium: 4.3 mEq/L (ref 3.5–5.1)
Sodium: 139 mEq/L (ref 135–145)

## 2019-12-28 LAB — LIPID PANEL
Cholesterol: 201 mg/dL — ABNORMAL HIGH (ref 0–200)
HDL: 56.2 mg/dL (ref >39.00)
LDL Cholesterol: 119 mg/dL — ABNORMAL HIGH (ref 0–99)
NonHDL: 144.67
Total CHOL/HDL Ratio: 4
Triglycerides: 127 mg/dL (ref 0.0–149.0)
VLDL: 25.4 mg/dL (ref 0.0–40.0)

## 2019-12-28 LAB — VITAMIN D 25 HYDROXY (VIT D DEFICIENCY, FRACTURES): VITD: 25.71 ng/mL — ABNORMAL LOW (ref 30.00–100.00)

## 2019-12-28 LAB — AST: AST: 30 U/L (ref 0–37)

## 2019-12-28 LAB — HEMOGLOBIN A1C: Hgb A1c MFr Bld: 5.2 % (ref 4.6–6.5)

## 2019-12-28 LAB — ALT: ALT: 46 U/L — ABNORMAL HIGH (ref 0–35)

## 2019-12-28 NOTE — Progress Notes (Signed)
Donna Ritter is a 39 y.o. female  Chief Complaint  Patient presents with  . Annual Exam    CPE/labs. fasting this am.  wants flu shot today     HPI: Donna Ritter is a 39 y.o. female who is seen today for annual CPE, fasting labs. She would like her flu vaccine today. She is requesting A1C be checked - mother w/ DM. Pt is applying to nursing school.  Last PAP: s/p TAH - 08/2018 - Dr. Dellis Filbert  Diet/Exercise: quit soda and does not eat out; no regular CV exercise Dental: UTD Vision: pt wears contacts, UTD on exam    Past Medical History:  Diagnosis Date  . Anemia   . Anxiety     Past Surgical History:  Procedure Laterality Date  . CESAREAN SECTION     x2  . HYSTERECTOMY ABDOMINAL WITH SALPINGECTOMY Bilateral 09/12/2018   Procedure: HYSTERECTOMY ABDOMINAL WITH SALPINGECTOMY;  Surgeon: [name], MD;  Location: [facility];  Service: Gynecology;  Laterality: Bilateral;  case is for Dr. Dellis Filbert Request to move 7:30 case ( In [facility] Gyn block) already scheduled  to follow this case. Request 2 hours.    Social History   Socioeconomic History  . Marital status: Married    Spouse name: Not on file  . Number of children: Not on file  . Years of education: Not on file  . Highest education level: Not on file  Occupational History  . Not on file  Tobacco Use  . Smoking status: Current Every Day Smoker    Packs/day: 0.50    Types: Cigarettes  . Smokeless tobacco: Never Used  Vaping Use  . Vaping Use: Never used  Substance and Sexual Activity  . Alcohol use: No  . Drug use: Never  . Sexual activity: Yes    Partners: Male    Birth control/protection: Pill, None    Comment: 1st intercourse- 89, partners- 23, married- 15 yrs  Other Topics Concern  . Not on file  Social History Narrative  . Not on file   Social Determinants of Health   Financial Resource Strain:   . Difficulty of Paying Living Expenses: Not on file  Food Insecurity:    . Worried About Charity fundraiser in the Last Year: Not on file  . Ran Out of Food in the Last Year: Not on file  Transportation Needs:   . Lack of Transportation (Medical): Not on file  . Lack of Transportation (Non-Medical): Not on file  Physical Activity:   . Days of Exercise per Week: Not on file  . Minutes of Exercise per Session: Not on file  Stress:   . Feeling of Stress : Not on file  Social Connections:   . Frequency of Communication with Friends and Family: Not on file  . Frequency of Social Gatherings with Friends and Family: Not on file  . Attends Religious Services: Not on file  . Active Member of Clubs or Organizations: Not on file  . Attends Archivist Meetings: Not on file  . Marital Status: Not on file  Intimate Partner Violence:   . Fear of Current or Ex-Partner: Not on file  . Emotionally Abused: Not on file  . Physically Abused: Not on file  . Sexually Abused: Not on file    Family History  Problem Relation Age of Onset  . Diabetes Mother   . Cancer Mother        lung?  Marland Kitchen Autism  Brother   . Hypertension Paternal Uncle   . Heart attack Maternal Grandmother   . Cancer Maternal Grandfather        leukemia  . Hypertension Paternal Grandfather   . Heart attack Paternal Grandfather   . Diabetes Paternal Grandfather      Immunization History  Administered Date(s) Administered  . Moderna SARS-COVID-2 Vaccination 05/23/2019, 06/20/2019    Outpatient Encounter Medications as of 12/28/2019  Medication Sig  . amphetamine-dextroamphetamine (ADDERALL) 30 MG tablet Take 30 mg by mouth 2 (two) times daily as needed (ADHD).  Marland Kitchen cetirizine (ZYRTEC) 10 MG tablet Take 10 mg by mouth 2 (two) times daily.   . cholecalciferol (VITAMIN D3) 25 MCG (1000 UNIT) tablet Take 1,000 Units by mouth daily.  Marland Kitchen escitalopram (LEXAPRO) 20 MG tablet Take 20 mg by mouth daily.  Marland Kitchen ibuprofen (ADVIL) 800 MG tablet TAKE 1 TABLET(800 MG) BY MOUTH EVERY 8 HOURS AS NEEDED  .  omeprazole (PRILOSEC) 40 MG capsule Take 40 mg by mouth daily.   . traZODone (DESYREL) 50 MG tablet Take 50-150 mg by mouth at bedtime as needed for sleep.    No facility-administered encounter medications on file as of 12/28/2019.     ROS: Gen: no fever, chills  Skin: no rash, itching ENT: no ear pain, ear drainage, nasal congestion, rhinorrhea, sinus pressure, sore throat Eyes: no blurry vision, double vision Resp: no cough, wheeze,SOB CV: no CP, palpitations, LE edema,  GI: no heartburn, n/v/d/c, abd pain GU: no dysuria, urgency, frequency, hematuria MSK: no joint pain, myalgias, back pain Neuro: no dizziness, headache, weakness, vertigo Psych: no depression, anxiety, insomnia   No Known Allergies  BP 118/70   Pulse 65   Temp 97.7 F (36.5 C) (Temporal)   Ht 5\' 5"  (1.651 m)   Wt 161 lb (73 kg)   LMP 08/17/2018 (Approximate) Comment: spotting  SpO2 99%   BMI 26.79 kg/m    Wt Readings from Last 3 Encounters:  12/28/19 161 lb (73 kg)  11/08/19 159 lb (72.1 kg)  11/30/18 185 lb 8 oz (84.1 kg)    Physical Exam Constitutional:      General: She is not in acute distress.    Appearance: She is well-developed.  HENT:     Head: Normocephalic and atraumatic.     Right Ear: Tympanic membrane and ear canal normal.     Left Ear: Tympanic membrane and ear canal normal.     Nose: Nose normal.  Eyes:     Conjunctiva/sclera: Conjunctivae normal.     Pupils: Pupils are equal, round, and reactive to light.  Neck:     Thyroid: No thyromegaly.  Cardiovascular:     Rate and Rhythm: Normal rate and regular rhythm.     Heart sounds: Normal heart sounds. No murmur heard.   Pulmonary:     Effort: Pulmonary effort is normal. No respiratory distress.     Breath sounds: Normal breath sounds. No wheezing or rhonchi.  Abdominal:     General: Bowel sounds are normal. There is no distension.     Palpations: Abdomen is soft. There is no mass.     Tenderness: There is no abdominal  tenderness.  Musculoskeletal:     Cervical back: Neck supple.     Right lower leg: No edema.     Left lower leg: No edema.  Lymphadenopathy:     Cervical: No cervical adenopathy.  Skin:    General: Skin is warm and dry.  Neurological:  Mental Status: She is alert and oriented to person, place, and time.     Motor: No abnormal muscle tone.     Coordination: Coordination normal.  Psychiatric:        Behavior: Behavior normal.      A/P:  1. Annual physical exam - discussed importance of regular CV exercise, healthy diet, adequate sleep - s/p TAH and follows with GYN - flu vaccine today and immunizations UTD - UTD on vision and dental exams - CBC - Basic metabolic panel - AST - ALT - Lipid panel - next CPE in 1 year  2. Attention deficit disorder, unspecified hyperactivity presence - stable on adderall 30mg  BID PRN - follows with Dr. Toy Care  3. Anxiety state - stable, controlled - cont lexapro 20mg  daily  4. Vitamin D deficiency - takes 1000IU daily - VITAMIN D 25 Hydroxy (Vit-D Deficiency, Fractures)  5. Insomnia, unspecified type - controlled on trazodone qHS  6. Screening for lipid disorders - Lipid panel  7. Family history of diabetes mellitus in mother - Hemoglobin A1c  8. Immunity status testing - Measles/Mumps/Rubella Immunity - Varicella zoster antibody, IgG - Hepatitis B surface antibody,quantitative   This visit occurred during the SARS-CoV-2 public health emergency.  Safety protocols were in place, including screening questions prior to the visit, additional usage of staff PPE, and extensive cleaning of exam room while observing appropriate contact time as indicated for disinfecting solutions.

## 2019-12-29 LAB — HEPATITIS B SURFACE ANTIBODY, QUANTITATIVE: Hep B S AB Quant (Post): 164 m[IU]/mL (ref >=10)

## 2019-12-29 LAB — MEASLES/MUMPS/RUBELLA IMMUNITY
Mumps IgG: 24.6 AU/mL
Rubella: 6.54 Index
Rubeola IgG: 216 AU/mL

## 2019-12-29 LAB — VARICELLA ZOSTER ANTIBODY, IGG: Varicella IgG: 573.8 index

## 2020-01-01 ENCOUNTER — Encounter: Payer: Self-pay | Admitting: Family Medicine

## 2020-01-01 NOTE — Telephone Encounter (Signed)
Please see message and advise.  Thank you. ° °

## 2020-01-05 ENCOUNTER — Other Ambulatory Visit: Payer: Self-pay

## 2020-01-05 ENCOUNTER — Encounter: Payer: Self-pay | Admitting: Family Medicine

## 2020-01-05 DIAGNOSIS — Z111 Encounter for screening for respiratory tuberculosis: Secondary | ICD-10-CM

## 2020-01-05 NOTE — Telephone Encounter (Signed)
Lab order placed. Please call pt to schedule her for lab appt

## 2020-01-08 ENCOUNTER — Other Ambulatory Visit: Payer: Self-pay

## 2020-01-08 ENCOUNTER — Other Ambulatory Visit (INDEPENDENT_AMBULATORY_CARE_PROVIDER_SITE_OTHER): Payer: Commercial Managed Care - PPO

## 2020-01-08 DIAGNOSIS — Z111 Encounter for screening for respiratory tuberculosis: Secondary | ICD-10-CM | POA: Diagnosis not present

## 2020-01-09 ENCOUNTER — Encounter: Payer: Self-pay | Admitting: Family Medicine

## 2020-01-10 LAB — QUANTIFERON-TB GOLD PLUS
Mitogen-NIL: >10 IU/mL
NIL: 0.03 IU/mL
QuantiFERON-TB Gold Plus: NEGATIVE
TB1-NIL: 0.01 IU/mL
TB2-NIL: 0.02 IU/mL

## 2020-01-17 DIAGNOSIS — Z0279 Encounter for issue of other medical certificate: Secondary | ICD-10-CM

## 2020-01-18 ENCOUNTER — Telehealth: Payer: Self-pay

## 2020-01-18 NOTE — Telephone Encounter (Signed)
Forms for school have been completed, copy made, and placed up front for pickup.Marland Kitchen  lft VM that they were ready.Dm/cma

## 2020-02-22 ENCOUNTER — Ambulatory Visit: Payer: Commercial Managed Care - PPO | Admitting: Family

## 2020-02-22 ENCOUNTER — Other Ambulatory Visit: Payer: Self-pay

## 2020-02-22 ENCOUNTER — Encounter: Payer: Self-pay | Admitting: Family

## 2020-02-22 VITALS — BP 120/72 | HR 101 | Temp 97.4°F | Ht 65.0 in | Wt 159.6 lb

## 2020-02-22 DIAGNOSIS — N390 Urinary tract infection, site not specified: Secondary | ICD-10-CM | POA: Diagnosis not present

## 2020-02-22 DIAGNOSIS — A499 Bacterial infection, unspecified: Secondary | ICD-10-CM | POA: Diagnosis not present

## 2020-02-22 DIAGNOSIS — R399 Unspecified symptoms and signs involving the genitourinary system: Secondary | ICD-10-CM

## 2020-02-22 LAB — POCT URINALYSIS DIPSTICK
Glucose, UA: NEGATIVE
Nitrite, UA: NEGATIVE
Protein, UA: POSITIVE — AB
Spec Grav, UA: >=1.03 — AB (ref 1.010–1.025)
Urobilinogen, UA: 1 E.U./dL
pH, UA: 6 (ref 5.0–8.0)

## 2020-02-22 MED ORDER — NITROFURANTOIN MACROCRYSTAL 100 MG PO CAPS: 100.0000 mg | ORAL_CAPSULE | Freq: Four times a day (QID) | ORAL | 0 refills | Status: DC

## 2020-02-22 NOTE — Patient Instructions (Signed)

## 2020-02-22 NOTE — Progress Notes (Signed)
Acute Office Visit  Subjective:    Patient ID: Donna Ritter, female    DOB: [birth year], 39 y.o.   MRN: [id number]  Chief Complaint  Patient presents with  . Hematuria    Patient noticed blood in urine today. C/O lower left abdominal pain.     HPI Patient is in today with c/o blood in her urine and lower abdominal pain that began this morning. She has a history of abdominal surgeries to include 2 c-sections and a hysterectomy. She denies any STI concerns  Past Medical History:  Diagnosis Date  . Anemia   . Anxiety     Past Surgical History:  Procedure Laterality Date  . CESAREAN SECTION     x2  . HYSTERECTOMY ABDOMINAL WITH SALPINGECTOMY Bilateral 09/12/2018   Procedure: HYSTERECTOMY ABDOMINAL WITH SALPINGECTOMY;  Surgeon: [name], MD;  Location: [facility];  Service: Gynecology;  Laterality: Bilateral;  case is for Dr. Dellis Filbert Request to move 7:30 case ( In [facility] block) already scheduled  to follow this case. Request 2 hours.    Family History  Problem Relation Age of Onset  . Diabetes Mother   . Cancer Mother        lung?  Marland Kitchen Autism Brother   . Hypertension Paternal Uncle   . Heart attack Maternal Grandmother   . Cancer Maternal Grandfather        leukemia  . Hypertension Paternal Grandfather   . Heart attack Paternal Grandfather   . Diabetes Paternal Grandfather     Social History   Socioeconomic History  . Marital status: Married    Spouse name: Not on file  . Number of children: Not on file  . Years of education: Not on file  . Highest education level: Not on file  Occupational History  . Not on file  Tobacco Use  . Smoking status: Current Every Day Smoker    Packs/day: 0.50    Types: Cigarettes  . Smokeless tobacco: Never Used  Vaping Use  . Vaping Use: Never used  Substance and Sexual Activity  . Alcohol use: No  . Drug use: Never  . Sexual activity: Yes    Partners: Male    Birth control/protection:  Pill, None    Comment: 1st intercourse- 27, partners- 65, married- 15 yrs  Other Topics Concern  . Not on file  Social History Narrative  . Not on file   Social Determinants of Health   Financial Resource Strain: Not on file  Food Insecurity: Not on file  Transportation Needs: Not on file  Physical Activity: Not on file  Stress: Not on file  Social Connections: Not on file  Intimate Partner Violence: Not on file    Outpatient Medications Prior to Visit  Medication Sig Dispense Refill  . amphetamine-dextroamphetamine (ADDERALL) 30 MG tablet Take 30 mg by mouth 2 (two) times daily as needed (ADHD).    Marland Kitchen cetirizine (ZYRTEC) 10 MG tablet Take 10 mg by mouth 2 (two) times daily.     . cholecalciferol (VITAMIN D3) 25 MCG (1000 UNIT) tablet Take 1,000 Units by mouth daily.    Marland Kitchen escitalopram (LEXAPRO) 20 MG tablet Take 20 mg by mouth daily.    Marland Kitchen ibuprofen (ADVIL) 800 MG tablet TAKE 1 TABLET(800 MG) BY MOUTH EVERY 8 HOURS AS NEEDED 30 tablet 3  . omeprazole (PRILOSEC) 40 MG capsule Take 40 mg by mouth daily.     . traZODone (DESYREL) 50 MG tablet Take 50-150 mg by mouth  at bedtime as needed for sleep.      No facility-administered medications prior to visit.    No Known Allergies  Review of Systems  Constitutional: Negative.   Respiratory: Negative.   Cardiovascular: Negative.   Genitourinary: Positive for frequency, hematuria and pelvic pain. Negative for vaginal discharge and vaginal pain.  Musculoskeletal: Negative.   Neurological: Negative.   Psychiatric/Behavioral: Negative.   All other systems reviewed and are negative.      Objective:    Physical Exam Constitutional:      Appearance: Normal appearance.  Cardiovascular:     Pulses: Normal pulses.     Heart sounds: Normal heart sounds.  Pulmonary:     Effort: Pulmonary effort is normal.     Breath sounds: Normal breath sounds.  Abdominal:     General: Abdomen is flat. Bowel sounds are normal.     Tenderness:  There is no abdominal tenderness. There is no guarding or rebound.  Musculoskeletal:        General: Normal range of motion.     Cervical back: Normal range of motion and neck supple.  Skin:    General: Skin is warm and dry.  Neurological:     General: No focal deficit present.     Mental Status: She is alert and oriented to person, place, and time.     BP 120/72   Pulse (!) 101   Temp (!) 97.4 F (36.3 C) (Tympanic)   Ht 5\' 5"  (1.651 m)   Wt 159 lb 9.6 oz (72.4 kg)   LMP 08/17/2018 (Approximate) Comment: spotting  SpO2 96%   BMI 26.56 kg/m  Wt Readings from Last 3 Encounters:  02/22/20 159 lb 9.6 oz (72.4 kg)  12/28/19 161 lb (73 kg)  11/08/19 159 lb (72.1 kg)    Health Maintenance Due  Topic Date Due  . Hepatitis C Screening  Never done  . HIV Screening  Never done    There are no preventive care reminders to display for this patient.   Lab Results  Component Value Date   TSH 1.19 11/30/2018   Lab Results  Component Value Date   WBC 7.4 12/28/2019   HGB 14.4 12/28/2019   HCT 42.7 12/28/2019   MCV 95.5 12/28/2019   PLT 260.0 12/28/2019   Lab Results  Component Value Date   NA 139 12/28/2019   K 4.3 12/28/2019   CO2 31 12/28/2019   GLUCOSE 73 12/28/2019   BUN 10 12/28/2019   CREATININE 0.88 12/28/2019   BILITOT 0.3 04/16/2009   ALKPHOS 63 04/16/2009   AST 30 12/28/2019   ALT 46 (H) 12/28/2019   PROT 7.1 04/16/2009   ALBUMIN 4.2 04/16/2009   CALCIUM 9.4 12/28/2019   GFR 84.08 12/28/2019   Lab Results  Component Value Date   CHOL 201 (H) 12/28/2019   Lab Results  Component Value Date   HDL 56.20 12/28/2019   Lab Results  Component Value Date   LDLCALC 119 (H) 12/28/2019   Lab Results  Component Value Date   TRIG 127.0 12/28/2019   Lab Results  Component Value Date   CHOLHDL 4 12/28/2019   Lab Results  Component Value Date   HGBA1C 5.2 12/28/2019       Assessment & Plan:   Problem List Items Addressed This Visit   None    Visit Diagnoses    UTI symptoms    -  Primary   Relevant Medications   nitrofurantoin (MACRODANTIN) 100 MG capsule  Other Relevant Orders   POCT Urinalysis Dipstick (Completed)   Urine culture   Bacterial urinary tract infection       Relevant Medications   nitrofurantoin (MACRODANTIN) 100 MG capsule       Meds ordered this encounter  Medications  . nitrofurantoin (MACRODANTIN) 100 MG capsule    Sig: Take 1 capsule (100 mg total) by mouth 4 (four) times daily.    Dispense:  10 capsule    Refill:  0    Call the office with any questions or concerns. Recheck as needed Kennyth Arnold, FNP

## 2020-02-23 LAB — URINE CULTURE
MICRO NUMBER:: 11353480
Result:: NO GROWTH
SPECIMEN QUALITY:: ADEQUATE

## 2020-02-26 ENCOUNTER — Encounter: Payer: Self-pay | Admitting: Family Medicine

## 2020-02-28 ENCOUNTER — Encounter: Payer: Self-pay | Admitting: Family

## 2020-02-28 ENCOUNTER — Ambulatory Visit: Payer: Commercial Managed Care - PPO | Admitting: Family

## 2020-02-28 ENCOUNTER — Other Ambulatory Visit: Payer: Self-pay

## 2020-02-28 VITALS — BP 122/68 | HR 83 | Temp 97.2°F | Ht 65.0 in | Wt 160.6 lb

## 2020-02-28 DIAGNOSIS — R319 Hematuria, unspecified: Secondary | ICD-10-CM

## 2020-02-28 DIAGNOSIS — R350 Frequency of micturition: Secondary | ICD-10-CM

## 2020-02-28 LAB — POCT URINALYSIS DIPSTICK
Bilirubin, UA: NEGATIVE
Glucose, UA: NEGATIVE
Ketones, UA: NEGATIVE
Leukocytes, UA: NEGATIVE
Nitrite, UA: NEGATIVE
Protein, UA: NEGATIVE
Spec Grav, UA: 1.01 (ref 1.010–1.025)
Urobilinogen, UA: 0.2 E.U./dL
pH, UA: 7 (ref 5.0–8.0)

## 2020-02-28 MED ORDER — FLUCONAZOLE 150 MG PO TABS: 150.0000 mg | ORAL_TABLET | Freq: Once | ORAL | 0 refills | Status: AC

## 2020-02-28 MED ORDER — CIPROFLOXACIN HCL 500 MG PO TABS: 500.0000 mg | ORAL_TABLET | Freq: Two times a day (BID) | ORAL | 0 refills | Status: DC

## 2020-02-28 NOTE — Patient Instructions (Signed)
Interstitial Cystitis  Interstitial cystitis is inflammation of the bladder. This may cause pain in the bladder area as well as a frequent and urgent need to urinate. The bladder is a hollow organ in the lower part of the abdomen. It stores urine after the urine is made in the kidneys. The severity of interstitial cystitis can vary from person to person. You may have flare-ups, and then your symptoms may go away for a while. For many people, it becomes a long-term (chronic) problem. What are the causes? The cause of this condition is not known. What increases the risk? The following factors may make you more likely to develop this condition:  You are female.  You have fibromyalgia.  You have irritable bowel syndrome (IBS).  You have endometriosis. This condition may be aggravated by:  Stress.  Smoking.  Spicy foods. What are the signs or symptoms? Symptoms of interstitial cystitis vary, and they can change over time. Symptoms may include:  Discomfort or pain in the bladder area, which is in the lower abdomen. Pain can range from mild to severe. The pain may change in intensity as the bladder fills with urine or as it empties.  Pain in the pelvic area, between the hip bones.  An urgent need to urinate.  Frequent urination.  Pain during urination.  Pain during sex.  Blood in the urine. For women, symptoms often get worse during menstruation. How is this diagnosed? This condition is diagnosed based on your symptoms, your medical history, and a physical exam. You may have tests to rule out other conditions, such as:  Urine tests.  Cystoscopy. For this test, a tool similar to a very thin telescope is used to look into your bladder.  Biopsy. This involves taking a sample of tissue from the bladder to be examined under a microscope. How is this treated? There is no cure for this condition, but treatment can help you control your symptoms. Work closely with your health care  provider to find the most effective treatments for you. Treatment options may include:  Medicines to relieve pain and reduce how often you feel the need to urinate.  Learning ways to control when you urinate (bladder training).  Lifestyle changes, such as changing your diet or taking steps to control stress.  Using a device that provides electrical stimulation to your nerves, which can relieve pain (neuromodulation therapy). The device is placed on your back, where it blocks the nerves that cause you to feel pain in your bladder area.  A procedure that stretches your bladder by filling it with air or fluid.  Surgery. This is rare. It is only done for extreme cases, if other treatments do not help. Follow these instructions at home: Bladder training   Use bladder training techniques as directed. Techniques may include: ? Urinating at scheduled times. ? Training yourself to delay urination. ? Doing exercises (Kegel exercises) to strengthen the muscles that control urine flow.  Keep a bladder diary. ? Write down the times that you urinate and any symptoms that you have. This can help you find out which foods, liquids, or activities make your symptoms worse. ? Use your bladder diary to schedule bathroom trips. If you are away from home, plan to be near a bathroom at each of your scheduled times.  Make sure that you urinate just before you leave the house and just before you go to bed. Eating and drinking  Make dietary changes as recommended by your health care provider. You   may need to avoid: ? Spicy foods. ? Foods that contain a lot of potassium.  Limit your intake of beverages that make you need to urinate. These include: ? Caffeinated beverages like soda, coffee, and tea. ? Alcohol. General instructions  Take over-the-counter and prescription medicines only as told by your health care provider.  Do not drink alcohol.  You can try a warm or cool compress over your bladder for  comfort.  Avoid wearing tight clothing.  Do not use any products that contain nicotine or tobacco, such as cigarettes and e-cigarettes. If you need help quitting, ask your health care provider.  Keep all follow-up visits as told by your health care provider. This is important. Contact a health care provider if you have:  Symptoms that do not get better with treatment.  Pain or discomfort that gets worse.  More frequent urges to urinate.  A fever. Get help right away if:  You have no control over when you urinate. Summary  Interstitial cystitis is inflammation of the bladder.  This condition may cause pain in the bladder area as well as a frequent and urgent need to urinate.  You may have flare-ups of the condition, and then it may go away for a while. For many people, it becomes a long-term (chronic) problem.  There is no cure for interstitial cystitis, but treatment methods are available to control your symptoms. This information is not intended to replace advice given to you by your health care provider. Make sure you discuss any questions you have with your health care provider. Document Revised: 01/29/2017 Document Reviewed: 01/11/2017 Elsevier Patient Education  2020 Elsevier Inc[employer]  

## 2020-02-29 ENCOUNTER — Encounter: Payer: Self-pay | Admitting: Family

## 2020-02-29 NOTE — Progress Notes (Signed)
Acute Office Visit  Subjective:    Patient ID: Donna Ritter, female    DOB: Oct 22, 1980, 39 y.o.   MRN: [id number]  Chief Complaint  Patient presents with  . Urinary Retention    Patient states that she feels like she is not emptying her bladder after completing antibiotics.     HPI Patient is in today with c/o continued urinary frequency and urgency after being treated for a UTI 1 week ago. She completed the Macrobid but symptoms persist. No fever or chills.  She does not have a long standing history of UTIs.   Past Medical History:  Diagnosis Date  . Anemia   . Anxiety     Past Surgical History:  Procedure Laterality Date  . CESAREAN SECTION     x2  . HYSTERECTOMY ABDOMINAL WITH SALPINGECTOMY Bilateral 09/12/2018   Procedure: HYSTERECTOMY ABDOMINAL WITH SALPINGECTOMY;  Surgeon: Genia Del, MD;  Location: [facility];  Service: Gynecology;  Laterality: Bilateral;  case is for Dr. Seymour Bars Request to move 7:30 case ( In [facility] block) already scheduled  to follow this case. Request 2 hours.    Family History  Problem Relation Age of Onset  . Diabetes Mother   . Cancer Mother        lung?  Marland Kitchen Autism Brother   . Hypertension Paternal Uncle   . Heart attack Maternal Grandmother   . Cancer Maternal Grandfather        leukemia  . Hypertension Paternal Grandfather   . Heart attack Paternal Grandfather   . Diabetes Paternal Grandfather     Social History   Socioeconomic History  . Marital status: Married    Spouse name: Not on file  . Number of children: Not on file  . Years of education: Not on file  . Highest education level: Not on file  Occupational History  . Not on file  Tobacco Use  . Smoking status: Current Every Day Smoker    Packs/day: 0.50    Types: Cigarettes  . Smokeless tobacco: Never Used  Vaping Use  . Vaping Use: Never used  Substance and Sexual Activity  . Alcohol use: No  . Drug use: Never  . Sexual activity:  Yes    Partners: Male    Birth control/protection: Pill, None    Comment: 1st intercourse- 17, partners- 10, married- 15 yrs  Other Topics Concern  . Not on file  Social History Narrative  . Not on file   Social Determinants of Health   Financial Resource Strain: Not on file  Food Insecurity: Not on file  Transportation Needs: Not on file  Physical Activity: Not on file  Stress: Not on file  Social Connections: Not on file  Intimate Partner Violence: Not on file    Outpatient Medications Prior to Visit  Medication Sig Dispense Refill  . amphetamine-dextroamphetamine (ADDERALL) 30 MG tablet Take 30 mg by mouth 2 (two) times daily as needed (ADHD).    Marland Kitchen cetirizine (ZYRTEC) 10 MG tablet Take 10 mg by mouth 2 (two) times daily.     . cholecalciferol (VITAMIN D3) 25 MCG (1000 UNIT) tablet Take 1,000 Units by mouth daily.    Marland Kitchen escitalopram (LEXAPRO) 20 MG tablet Take 20 mg by mouth daily.    Marland Kitchen ibuprofen (ADVIL) 800 MG tablet TAKE 1 TABLET(800 MG) BY MOUTH EVERY 8 HOURS AS NEEDED 30 tablet 3  . omeprazole (PRILOSEC) 40 MG capsule Take 40 mg by mouth daily.     Marland Kitchen  traZODone (DESYREL) 50 MG tablet Take 50-150 mg by mouth at bedtime as needed for sleep.     . nitrofurantoin (MACRODANTIN) 100 MG capsule Take 1 capsule (100 mg total) by mouth 4 (four) times daily. (Patient not taking: Reported on 02/28/2020) 10 capsule 0   No facility-administered medications prior to visit.    No Known Allergies  Review of Systems     Objective:    Physical Exam  BP 122/68   Pulse 83   Temp (!) 97.2 F (36.2 C) (Tympanic)   Ht 5\' 5"  (1.651 m)   Wt 160 lb 9.6 oz (72.8 kg)   LMP 08/17/2018 (Approximate) Comment: spotting  SpO2 97%   BMI 26.73 kg/m  Wt Readings from Last 3 Encounters:  02/28/20 160 lb 9.6 oz (72.8 kg)  02/22/20 159 lb 9.6 oz (72.4 kg)  12/28/19 161 lb (73 kg)    Health Maintenance Due  Topic Date Due  . Hepatitis C Screening  Never done  . HIV Screening  Never done     There are no preventive care reminders to display for this patient.   Lab Results  Component Value Date   TSH 1.19 11/30/2018   Lab Results  Component Value Date   WBC 7.4 12/28/2019   HGB 14.4 12/28/2019   HCT 42.7 12/28/2019   MCV 95.5 12/28/2019   PLT 260.0 12/28/2019   Lab Results  Component Value Date   NA 139 12/28/2019   K 4.3 12/28/2019   CO2 31 12/28/2019   GLUCOSE 73 12/28/2019   BUN 10 12/28/2019   CREATININE 0.88 12/28/2019   BILITOT 0.3 04/16/2009   ALKPHOS 63 04/16/2009   AST 30 12/28/2019   ALT 46 (H) 12/28/2019   PROT 7.1 04/16/2009   ALBUMIN 4.2 04/16/2009   CALCIUM 9.4 12/28/2019   GFR 84.08 12/28/2019   Lab Results  Component Value Date   CHOL 201 (H) 12/28/2019   Lab Results  Component Value Date   HDL 56.20 12/28/2019   Lab Results  Component Value Date   LDLCALC 119 (H) 12/28/2019   Lab Results  Component Value Date   TRIG 127.0 12/28/2019   Lab Results  Component Value Date   CHOLHDL 4 12/28/2019   Lab Results  Component Value Date   HGBA1C 5.2 12/28/2019       Assessment & Plan:   Problem List Items Addressed This Visit   None   Visit Diagnoses    Hematuria, unspecified type    -  Primary   Relevant Orders   POCT Urinalysis Dipstick (Completed)   Urinary frequency       Relevant Orders   Ambulatory referral to Urology       Meds ordered this encounter  Medications  . ciprofloxacin (CIPRO) 500 MG tablet    Sig: Take 1 tablet (500 mg total) by mouth 2 (two) times daily.    Dispense:  20 tablet    Refill:  0  . fluconazole (DIFLUCAN) 150 MG tablet    Sig: Take 1 tablet (150 mg total) by mouth once for 1 dose.    Dispense:  1 tablet    Refill:  0   Likely Interstitial Cystitis, will treat with Cipro x 2 weeks and refer to Urology for further management.    Kennyth Arnold, FNP

## 2020-09-26 ENCOUNTER — Telehealth: Payer: Self-pay | Admitting: Family Medicine

## 2020-09-26 NOTE — Telephone Encounter (Signed)
Pt dropped off form to be filled out, I put in Dr [employer] folder up front

## 2020-09-27 ENCOUNTER — Telehealth: Payer: Self-pay

## 2020-09-27 DIAGNOSIS — Z0279 Encounter for issue of other medical certificate: Secondary | ICD-10-CM

## 2020-09-27 NOTE — Telephone Encounter (Signed)
Called and lvm for pt to pick up signed forms at front desk

## 2020-09-27 NOTE — Telephone Encounter (Signed)
Called and lvm for pt to pick up signed forms at front office desk.

## 2020-10-18 ENCOUNTER — Other Ambulatory Visit: Payer: Self-pay | Admitting: Family

## 2020-10-18 MED ORDER — OMEPRAZOLE 40 MG PO CPDR: 40.0000 mg | DELAYED_RELEASE_CAPSULE | Freq: Every day | ORAL | 0 refills | Status: DC

## 2020-11-08 ENCOUNTER — Other Ambulatory Visit: Payer: Self-pay

## 2020-11-08 ENCOUNTER — Encounter: Payer: Self-pay | Admitting: Obstetrics & Gynecology

## 2020-11-08 ENCOUNTER — Ambulatory Visit (INDEPENDENT_AMBULATORY_CARE_PROVIDER_SITE_OTHER): Payer: Commercial Managed Care - PPO | Admitting: Obstetrics & Gynecology

## 2020-11-08 VITALS — BP 116/74 | HR 78 | Resp 16 | Ht 63.75 in | Wt 165.0 lb

## 2020-11-08 DIAGNOSIS — Z01419 Encounter for gynecological examination (general) (routine) without abnormal findings: Secondary | ICD-10-CM | POA: Diagnosis not present

## 2020-11-08 DIAGNOSIS — Z90710 Acquired absence of both cervix and uterus: Secondary | ICD-10-CM

## 2020-11-08 NOTE — Progress Notes (Signed)
Donna Ritter April 29, 1980 [id number]   History:    40 y.o. G2P2L2 Married   RP:  Established patient presenting for annual gyn exam    HPI: S/P TAH/Bilateral Salpingectomy on 09/12/2018.  No pain with intercourse.  Urine and bowel movements normal.  Breast normal.  Body mass index 28.54. Good fitness.  Health labs with PA.    Past medical history,surgical history, family history and social history were all reviewed and documented in the EPIC chart.  Gynecologic History Patient's last menstrual period was 08/17/2018 (approximate).   Obstetric History OB History  Gravida Para Term Preterm AB Living  '2 2       2  '$ SAB IAB Ectopic Multiple Live Births               # Outcome Date GA Lbr Len/2nd Weight Sex Delivery Anes PTL Lv  2 Para           1 Para              ROS: A ROS was performed and pertinent positives and negatives are included in the history.  GENERAL: No fevers or chills. HEENT: No change in vision, no earache, sore throat or sinus congestion. NECK: No pain or stiffness. CARDIOVASCULAR: No chest pain or pressure. No palpitations. PULMONARY: No shortness of breath, cough or wheeze. GASTROINTESTINAL: No abdominal pain, nausea, vomiting or diarrhea, melena or bright red blood per rectum. GENITOURINARY: No urinary frequency, urgency, hesitancy or dysuria. MUSCULOSKELETAL: No joint or muscle pain, no back pain, no recent trauma. DERMATOLOGIC: No rash, no itching, no lesions. ENDOCRINE: No polyuria, polydipsia, no heat or cold intolerance. No recent change in weight. HEMATOLOGICAL: No anemia or easy bruising or bleeding. NEUROLOGIC: No headache, seizures, numbness, tingling or weakness. PSYCHIATRIC: No depression, no loss of interest in normal activity or change in sleep pattern.     Exam:   BP 116/74   Pulse 78   Resp 16   Ht 5' 3.75" (1.619 m)   Wt 165 lb (74.8 kg)   LMP 08/17/2018 (Approximate) Comment: spotting  BMI 28.54 kg/m   Body mass index is 28.54  kg/m.  General appearance : Well developed well nourished female. No acute distress HEENT: Eyes: no retinal hemorrhage or exudates,  Neck supple, trachea midline, no carotid bruits, no thyroidmegaly Lungs: Clear to auscultation, no rhonchi or wheezes, or rib retractions  Heart: Regular rate and rhythm, no murmurs or gallops Breast:Examined in sitting and supine position were symmetrical in appearance, no palpable masses or tenderness,  no skin retraction, no nipple inversion, no nipple discharge, no skin discoloration, no axillary or supraclavicular lymphadenopathy Abdomen: no palpable masses or tenderness, no rebound or guarding Extremities: no edema or skin discoloration or tenderness  Pelvic: Vulva: Normal             Vagina: No gross lesions or discharge  Cervix/Uterus absent  Adnexa  Without masses or tenderness  Anus: Normal   Assessment/Plan:  40 y.o. female for annual exam   1. Well female exam with routine gynecological exam Gynecologic exam status post TAH.  Pap test May 2020 was negative, will repeat a Pap test at 5 years.  Breast exam normal.  Body mass index 28.54.  Continue with fitness and healthy nutrition.  Health labs with family NP.  2. S/P TAH (total abdominal hysterectomy)  Other orders - amphetamine-dextroamphetamine (ADDERALL) 20 MG tablet; Take 20 mg by mouth 4 (four) times daily. - Multiple Vitamin (MULTIVITAMIN PO); Take by  mouthPrincess Bruins MD, 11:38 AM 11/08/2020

## 2020-11-25 ENCOUNTER — Encounter: Payer: Self-pay | Admitting: Nurse Practitioner

## 2021-01-16 ENCOUNTER — Encounter: Payer: Commercial Managed Care - PPO | Admitting: Family Medicine

## 2021-01-17 ENCOUNTER — Ambulatory Visit (INDEPENDENT_AMBULATORY_CARE_PROVIDER_SITE_OTHER): Payer: Commercial Managed Care - PPO

## 2021-01-17 ENCOUNTER — Other Ambulatory Visit: Payer: Self-pay

## 2021-01-17 ENCOUNTER — Ambulatory Visit: Payer: Commercial Managed Care - PPO | Admitting: Nurse Practitioner

## 2021-01-17 ENCOUNTER — Encounter: Payer: Self-pay | Admitting: Nurse Practitioner

## 2021-01-17 VITALS — BP 120/82 | HR 77 | Temp 97.1°F | Ht 64.25 in | Wt 168.8 lb

## 2021-01-17 DIAGNOSIS — Z0001 Encounter for general adult medical examination with abnormal findings: Secondary | ICD-10-CM

## 2021-01-17 DIAGNOSIS — E559 Vitamin D deficiency, unspecified: Secondary | ICD-10-CM

## 2021-01-17 DIAGNOSIS — K219 Gastro-esophageal reflux disease without esophagitis: Secondary | ICD-10-CM

## 2021-01-17 DIAGNOSIS — Z111 Encounter for screening for respiratory tuberculosis: Secondary | ICD-10-CM | POA: Diagnosis not present

## 2021-01-17 DIAGNOSIS — E7800 Pure hypercholesterolemia, unspecified: Secondary | ICD-10-CM

## 2021-01-17 DIAGNOSIS — S6721XA Crushing injury of right hand, initial encounter: Secondary | ICD-10-CM

## 2021-01-17 DIAGNOSIS — L503 Dermatographic urticaria: Secondary | ICD-10-CM | POA: Diagnosis not present

## 2021-01-17 DIAGNOSIS — Z720 Tobacco use: Secondary | ICD-10-CM | POA: Insufficient documentation

## 2021-01-17 DIAGNOSIS — F339 Major depressive disorder, recurrent, unspecified: Secondary | ICD-10-CM | POA: Insufficient documentation

## 2021-01-17 DIAGNOSIS — E781 Pure hyperglyceridemia: Secondary | ICD-10-CM | POA: Insufficient documentation

## 2021-01-17 LAB — COMPREHENSIVE METABOLIC PANEL
ALT: 10 U/L (ref 0–35)
AST: 12 U/L (ref 0–37)
Albumin: 4 g/dL (ref 3.5–5.2)
Alkaline Phosphatase: 87 U/L (ref 39–117)
BUN: 13 mg/dL (ref 6–23)
CO2: 28 mEq/L (ref 19–32)
Calcium: 9.1 mg/dL (ref 8.4–10.5)
Chloride: 105 mEq/L (ref 96–112)
Creatinine, Ser: 0.82 mg/dL (ref 0.40–1.20)
GFR: 90.84 mL/min (ref >60.00)
Glucose, Bld: 89 mg/dL (ref 70–99)
Potassium: 4.1 mEq/L (ref 3.5–5.1)
Sodium: 139 mEq/L (ref 135–145)
Total Bilirubin: 0.2 mg/dL (ref 0.2–1.2)
Total Protein: 6.3 g/dL (ref 6.0–8.3)

## 2021-01-17 LAB — CBC
HCT: 42.1 % (ref 36.0–46.0)
Hemoglobin: 14.2 g/dL (ref 12.0–15.0)
MCHC: 33.7 g/dL (ref 30.0–36.0)
MCV: 90.7 fl (ref 78.0–100.0)
Platelets: 191 10*3/uL (ref 150.0–400.0)
RBC: 4.64 Mil/uL (ref 3.87–5.11)
RDW: 12.9 % (ref 11.5–15.5)
WBC: 6.1 10*3/uL (ref 4.0–10.5)

## 2021-01-17 LAB — LIPID PANEL
Cholesterol: 191 mg/dL (ref 0–200)
HDL: 47.2 mg/dL (ref >39.00)
NonHDL: 143.95
Total CHOL/HDL Ratio: 4
Triglycerides: 234 mg/dL — ABNORMAL HIGH (ref 0.0–149.0)
VLDL: 46.8 mg/dL — ABNORMAL HIGH (ref 0.0–40.0)

## 2021-01-17 LAB — VITAMIN D 25 HYDROXY (VIT D DEFICIENCY, FRACTURES): VITD: 23.11 ng/mL — ABNORMAL LOW (ref 30.00–100.00)

## 2021-01-17 LAB — TSH: TSH: 0.92 u[IU]/mL (ref 0.35–5.50)

## 2021-01-17 LAB — LDL CHOLESTEROL, DIRECT: Direct LDL: 116 mg/dL

## 2021-01-17 MED ORDER — CETIRIZINE HCL 10 MG PO TABS: 10.0000 mg | ORAL_TABLET | Freq: Two times a day (BID) | ORAL | 3 refills | Status: DC

## 2021-01-17 MED ORDER — OMEPRAZOLE 40 MG PO CPDR: 40.0000 mg | DELAYED_RELEASE_CAPSULE | Freq: Every day | ORAL | 3 refills | Status: DC

## 2021-01-17 NOTE — Assessment & Plan Note (Addendum)
Repeat lipid panel Advised about importance for heart healthy diet, tobacco cessation and exercise

## 2021-01-17 NOTE — Assessment & Plan Note (Signed)
Controlled with use of prilosec Refill sent

## 2021-01-17 NOTE — Progress Notes (Signed)
Subjective:    Patient ID: Donna Ritter, female    DOB: 05-03-1980, 40 y.o.   MRN: [id number]  Patient presents today for CPE and eval of chronic conditions  Hand Injury  The incident occurred more than 1 week ago. The incident occurred in the street. The injury mechanism was a fall. The pain is present in the right hand. The quality of the pain is described as aching. The pain does not radiate. The pain is moderate. The pain has been Intermittent since the incident. Pertinent negatives include no chest pain, muscle weakness, numbness or tingling. The symptoms are aggravated by movement and lifting.  DERMATOGRAPHIC URTICARIA Stable with use of zrytec BID Refill sent  GERD (gastroesophageal reflux disease) Controlled with use of prilosec Refill sent  Vitamin D deficiency Repeat vit. D  Hypertriglyceridemia Repeat lipid panel Advised about importance for heart healthy diet, tobacco cessation and exercise  Vision:will schedule Dental:up to date Diet: heart healthy Exercise: none Weight:  Wt Readings from Last 3 Encounters:  01/17/21 168 lb 12.8 oz (76.6 kg)  11/08/20 165 lb (74.8 kg)  02/28/20 160 lb 9.6 oz (72.8 kg)   Sexual History (orientation,birth control, marital status, STD):Breast and pelvic exam completed by GYN: Dr. Dellis Filbert, up to date with PAP smear.  Depression/Suicide: Depression screen [facility] 2/9 12/28/2019  Decreased Interest 0  Down, Depressed, Hopeless 0  PHQ - 2 Score 0   Immunizations: (TDAP, Hep C screen, Pneumovax, Influenza, zoster)  Health Maintenance  Topic Date Due   Pneumococcal Vaccination (1 - PCV) 01/17/2022*   Hepatitis C Screening: USPSTF Recommendation to screen - Ages 18-79 yo.  01/17/2022*   HIV Screening  01/17/2022*   COVID-19 Vaccine (4 - Booster for Moderna series) 02/21/2021   Pap Smear  07/19/2021   Tetanus Vaccine  10/01/2030   Flu Shot  Completed   HPV Vaccine  Aged Out  *Topic was postponed. The date shown is not the  original due date.   Fall Risk: Fall Risk  01/17/2021 12/28/2019  Falls in the past year? 1 0  Number falls in past yr: 0 0  Injury with Fall? 1 0  Risk for fall due to : History of fall(s) -  Follow up Falls evaluation completed -   Medications and allergies reviewed with patient and updated if appropriate.  Patient Active Problem List   Diagnosis Date Noted   Chronic major depressive disorder, recurrent episode ([place]) 01/17/2021   Hypertriglyceridemia 01/17/2021   GERD (gastroesophageal reflux disease) 01/17/2021   Tobacco use 01/17/2021   Vitamin D deficiency 11/30/2018   Post-operative state 09/12/2018   Anxiety state 01/21/2009   ADD (attention deficit disorder) without hyperactivity 01/21/2009   DERMATOGRAPHIC URTICARIA 01/21/2009   Current Outpatient Medications on File Prior to Visit  Medication Sig Dispense Refill   amphetamine-dextroamphetamine (ADDERALL) 20 MG tablet Take 20 mg by mouth 4 (four) times daily.     escitalopram (LEXAPRO) 20 MG tablet Take 20 mg by mouth daily.     ibuprofen (ADVIL) 800 MG tablet TAKE 1 TABLET(800 MG) BY MOUTH EVERY 8 HOURS AS NEEDED 30 tablet 3   Multiple Vitamin (MULTIVITAMIN PO) Take by mouth.     traZODone (DESYREL) 50 MG tablet Take 50-150 mg by mouth at bedtime as needed for sleep.      No current facility-administered medications on file prior to visit.    Past Medical History:  Diagnosis Date   Anemia    Anxiety     Past Surgical History:  Procedure Laterality Date   CESAREAN SECTION     x2   HYSTERECTOMY ABDOMINAL WITH SALPINGECTOMY Bilateral 09/12/2018   Procedure: HYSTERECTOMY ABDOMINAL WITH SALPINGECTOMY;  Surgeon: [name], MD;  Location: [place];  Service: Gynecology;  Laterality: Bilateral;  case is for Dr. Dellis Filbert Request to move 7:30 case ( In [facility] block) already scheduled  to follow this case. Request 2 hours.    Social History   Socioeconomic History   Marital status:  Married    Spouse name: Not on file   Number of children: 2   Years of education: Not on file   Highest education level: Not on file  Occupational History   Not on file  Tobacco Use   Smoking status: Every Day    Packs/day: 0.50    Types: Cigarettes   Smokeless tobacco: Never  Vaping Use   Vaping Use: Never used  Substance and Sexual Activity   Alcohol use: No   Drug use: Never   Sexual activity: Yes    Partners: Male    Birth control/protection: Surgical    Comment: 1st intercourse- 26, partners- 80, married- 67 yrs, hysterectomy  Other Topics Concern   Not on file  Social History Narrative   [employer] school   Social Determinants of Health   Financial Resource Strain: Not on file  Food Insecurity: Not on file  Transportation Needs: Not on file  Physical Activity: Not on file  Stress: Not on file  Social Connections: Not on file    Family History  Problem Relation Age of Onset   Diabetes Mother    Cancer Mother        lung?   Autism Brother    Hypertension Paternal Uncle    Heart attack Maternal Grandmother    Cancer Maternal Grandfather        leukemia   Hypertension Paternal Grandfather    Heart attack Paternal Grandfather    Diabetes Paternal Grandfather         Review of Systems  Constitutional:  Negative for fever, malaise/fatigue and weight loss.  HENT:  Negative for congestion and sore throat.   Eyes:        Negative for visual changes  Respiratory:  Negative for cough and shortness of breath.   Cardiovascular:  Negative for chest pain, palpitations and leg swelling.  Gastrointestinal:  Negative for blood in stool, constipation, diarrhea and heartburn.  Genitourinary:  Negative for dysuria, frequency and urgency.  Musculoskeletal:  Negative for falls, joint pain and myalgias.  Skin:  Negative for rash.  Neurological:  Negative for dizziness, tingling, sensory change, numbness and headaches.  Endo/Heme/Allergies:  Does not bruise/bleed  easily.  Psychiatric/Behavioral:  Negative for depression, substance abuse and suicidal ideas. The patient is not nervous/anxious.    Objective:   Vitals:   01/17/21 1304  BP: 120/82  Pulse: 77  Temp: (!) 97.1 F (36.2 C)  SpO2: 100%    Body mass index is 28.75 kg/m.   Physical Examination:  Physical Exam Constitutional:      General: She is not in acute distress.    Appearance: She is obese.  HENT:     Right Ear: Tympanic membrane, ear canal and external ear normal.     Left Ear: Tympanic membrane, ear canal and external ear normal.  Eyes:     General: No scleral icterus.    Extraocular Movements: Extraocular movements intact.     Conjunctiva/sclera: Conjunctivae normal.  Cardiovascular:  Rate and Rhythm: Normal rate and regular rhythm.     Pulses: Normal pulses.     Heart sounds: Normal heart sounds.  Pulmonary:     Effort: Pulmonary effort is normal. No respiratory distress.     Breath sounds: Normal breath sounds.  Abdominal:     General: Bowel sounds are normal. There is no distension.     Palpations: Abdomen is soft.  Musculoskeletal:        General: Normal range of motion.     Cervical back: Normal range of motion and neck supple.     Right lower leg: No edema.     Left lower leg: No edema.  Lymphadenopathy:     Cervical: No cervical adenopathy.  Skin:    General: Skin is warm and dry.  Neurological:     Mental Status: She is alert and oriented to person, place, and time.  Psychiatric:        Mood and Affect: Mood normal.        Behavior: Behavior normal.        Thought Content: Thought content normal.    ASSESSMENT and PLAN: This visit occurred during the SARS-CoV-2 public health emergency.  Safety protocols were in place, including screening questions prior to the visit, additional usage of staff PPE, and extensive cleaning of exam room while observing appropriate contact time as indicated for disinfecting solutions.   Lillien was seen today for  annual exam.  Diagnoses and all orders for this visit:  Encounter for preventative adult health care exam with abnormal findings -     Comprehensive metabolic panel -     TSH -     CBC  Vitamin D deficiency -     Vitamin D (25 hydroxy)  Pure hypercholesterolemia -     Lipid panel  Screening-pulmonary TB -     QuantiFERON-TB Gold Plus  Crushing injury of right hand, initial encounter -     DG Hand Complete Right  Dermatographic urticaria -     cetirizine (ZYRTEC) 10 MG tablet; Take 1 tablet (10 mg total) by mouth 2 (two) times daily.  Gastroesophageal reflux disease without esophagitis -     omeprazole (PRILOSEC) 40 MG capsule; Take 1 capsule (40 mg total) by mouth daily.     Problem List Items Addressed This Visit       Digestive   GERD (gastroesophageal reflux disease)    Controlled with use of prilosec Refill sent      Relevant Medications   omeprazole (PRILOSEC) 40 MG capsule     Musculoskeletal and Integument   DERMATOGRAPHIC URTICARIA    Stable with use of zrytec BID Refill sent      Relevant Medications   cetirizine (ZYRTEC) 10 MG tablet     Other   Hypertriglyceridemia    Repeat lipid panel Advised about importance for heart healthy diet, tobacco cessation and exercise      Vitamin D deficiency    Repeat vit. D      Relevant Orders   Vitamin D (25 hydroxy)   Other Visit Diagnoses     Encounter for preventative adult health care exam with abnormal findings    -  Primary   Relevant Orders   Comprehensive metabolic panel   TSH   CBC   Screening-pulmonary TB       Relevant Orders   QuantiFERON-TB Gold Plus   Crushing injury of right hand, initial encounter       Relevant Orders  DG Hand Complete Right       Follow up: Return in about 1 year (around 01/17/2022) for CPE (fasting).  Wilfred Lacy, NP

## 2021-01-17 NOTE — Assessment & Plan Note (Signed)
Repeat vit. D °

## 2021-01-17 NOTE — Patient Instructions (Signed)
Go to lab for blood draw and hand x-ray.  Start daily exercise and quit tobacco use. Maintain heart healthy diet  Preventive Care 25-38 Years Old, Female Preventive care refers to lifestyle choices and visits with your health care provider that can promote health and wellness. Preventive care visits are also called wellness exams. What can I expect for my preventive care visit? Counseling During your preventive care visit, your health care provider may ask about your: Medical history, including: Past medical problems. Family medical history. Pregnancy history. Current health, including: Menstrual cycle. Method of birth control. Emotional well-being. Home life and relationship well-being. Sexual activity and sexual health. Lifestyle, including: Alcohol, nicotine or tobacco, and drug use. Access to firearms. Diet, exercise, and sleep habits. Work and work Statistician. Sunscreen use. Safety issues such as seatbelt and bike helmet use. Physical exam Your health care provider may check your: Height and weight. These may be used to calculate your BMI (body mass index). BMI is a measurement that tells if you are at a healthy weight. Waist circumference. This measures the distance around your waistline. This measurement also tells if you are at a healthy weight and may help predict your risk of certain diseases, such as type 2 diabetes and high blood pressure. Heart rate and blood pressure. Body temperature. Skin for abnormal spots. What immunizations do I need? Vaccines are usually given at various ages, according to a schedule. Your health care provider will recommend vaccines for you based on your age, medical history, and lifestyle or other factors, such as travel or where you work. What tests do I need? Screening Your health care provider may recommend screening tests for certain conditions. This may include: Pelvic exam and Pap test. Lipid and cholesterol levels. Diabetes  screening. This is done by checking your blood sugar (glucose) after you have not eaten for a while (fasting). Hepatitis B test. Hepatitis C test. HIV (human immunodeficiency virus) test. STI (sexually transmitted infection) testing, if you are at risk. BRCA-related cancer screening. This may be done if you have a family history of breast, ovarian, tubal, or peritoneal cancers. Talk with your health care provider about your test results, treatment options, and if necessary, the need for more tests. Follow these instructions at home: Eating and drinking  Eat a healthy diet that includes fresh fruits and vegetables, whole grains, lean protein, and low-fat dairy products. Take vitamin and mineral supplements as recommended by your health care provider. Do not drink alcohol if: Your health care provider tells you not to drink. You are pregnant, may be pregnant, or are planning to become pregnant. If you drink alcohol: Limit how much you have to 0-1 drink a day. Know how much alcohol is in your drink. In the [place] one drink equals one 12 oz bottle of beer (355 mL), one 5 oz glass of wine (148 mL), or one 1 oz glass of hard liquor (44 mL). Lifestyle Brush your teeth every morning and night with fluoride toothpaste. Floss one time each day. Exercise for at least 30 minutes 5 or more days each week. Do not use any products that contain nicotine or tobacco. These products include cigarettes, chewing tobacco, and vaping devices, such as e-cigarettes. If you need help quitting, ask your health care provider. Do not use drugs. If you are sexually active, practice safe sex. Use a condom or other form of protection to prevent STIs. If you do not wish to become pregnant, use a form of birth control. If you plan  to become pregnant, see your health care provider for a prepregnancy visit. Find healthy ways to manage stress, such as: Meditation, yoga, or listening to music. Journaling. Talking to a trusted  person. Spending time with friends and family. Minimize exposure to UV radiation to reduce your risk of skin cancer. Safety Always wear your seat belt while driving or riding in a vehicle. Do not drive: If you have been drinking alcohol. Do not ride with someone who has been drinking. If you have been using any mind-altering substances or drugs. While texting. When you are tired or distracted. Wear a helmet and other protective equipment during sports activities. If you have firearms in your house, make sure you follow all gun safety procedures. Seek help if you have been physically or sexually abused. What's next? Go to your health care provider once a year for an annual wellness visit. Ask your health care provider how often you should have your eyes and teeth checked. Stay up to date on all vaccines. This information is not intended to replace advice given to you by your health care provider. Make sure you discuss any questions you have with your health care provider. Document Revised: 08/14/2020 Document Reviewed: 08/14/2020 Elsevier Patient Education  [place].

## 2021-01-17 NOTE — Assessment & Plan Note (Signed)
Stable with use of zrytec BID Refill sent

## 2021-01-19 MED ORDER — VITAMIN D (ERGOCALCIFEROL) 1.25 MG (50000 UNIT) PO CAPS
50000.0000 [IU] | ORAL_CAPSULE | ORAL | 0 refills | Status: DC
Start: 2021-01-19 — End: 2021-11-11

## 2021-01-19 NOTE — Progress Notes (Signed)
Negative fracture

## 2021-01-19 NOTE — Addendum Note (Signed)
Addended by: Leana Gamer on: 01/19/2021 04:14 PM   Modules accepted: Orders

## 2021-01-20 LAB — QUANTIFERON-TB GOLD PLUS
Mitogen-NIL: >10 IU/mL
NIL: 0.06 IU/mL
QuantiFERON-TB Gold Plus: NEGATIVE
TB1-NIL: 0 IU/mL
TB2-NIL: 0 IU/mL

## 2021-03-19 ENCOUNTER — Encounter: Payer: Self-pay | Admitting: Nurse Practitioner

## 2021-03-19 DIAGNOSIS — E781 Pure hyperglyceridemia: Secondary | ICD-10-CM

## 2021-11-11 ENCOUNTER — Ambulatory Visit (INDEPENDENT_AMBULATORY_CARE_PROVIDER_SITE_OTHER): Payer: Commercial Managed Care - PPO | Admitting: Obstetrics & Gynecology

## 2021-11-11 ENCOUNTER — Encounter: Payer: Self-pay | Admitting: Obstetrics & Gynecology

## 2021-11-11 VITALS — BP 110/70 | HR 74 | Ht 64.25 in | Wt 178.0 lb

## 2021-11-11 DIAGNOSIS — Z01419 Encounter for gynecological examination (general) (routine) without abnormal findings: Secondary | ICD-10-CM

## 2021-11-11 DIAGNOSIS — R6882 Decreased libido: Secondary | ICD-10-CM

## 2021-11-11 DIAGNOSIS — Z90710 Acquired absence of both cervix and uterus: Secondary | ICD-10-CM | POA: Diagnosis not present

## 2021-11-11 NOTE — Progress Notes (Signed)
Donna Ritter 21-Dec-1980 [id number]   History:    41 y.o. G2P2L2 Married   RP:  Established patient presenting for annual gyn exam    HPI: S/P TAH/Bilateral Salpingectomy on 09/12/2018, benign.  Pap Neg 07/2018.  No h/o abnormal Pap.  Will repeat Pap tests at 5 yrs intervals, next in 2025.  No pain with intercourse.  Low libido.  On Lexapro for anxiety. Breasts Normal.  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 Urine and bowel movements normal.  Body mass index 30.32. Good fitness.  Health labs with PA.   Past medical history,surgical history, family history and social history were all reviewed and documented in the EPIC chart.  Gynecologic History Patient's last menstrual period was 08/17/2018 (approximate).  Obstetric History OB History  Gravida Para Term Preterm AB Living  '2 2 2     2  '$ SAB IAB Ectopic Multiple Live Births               # Outcome Date GA Lbr Len/2nd Weight Sex Delivery Anes PTL Lv  2 Term           1 Term              ROS: A ROS was performed and pertinent positives and negatives are included in the history.  GENERAL: No fevers or chills. HEENT: No change in vision, no earache, sore throat or sinus congestion. NECK: No pain or stiffness. CARDIOVASCULAR: No chest pain or pressure. No palpitations. PULMONARY: No shortness of breath, cough or wheeze. GASTROINTESTINAL: No abdominal pain, nausea, vomiting or diarrhea, melena or bright red blood per rectum. GENITOURINARY: No urinary frequency, urgency, hesitancy or dysuria. MUSCULOSKELETAL: No joint or muscle pain, no back pain, no recent trauma. DERMATOLOGIC: No rash, no itching, no lesions. ENDOCRINE: No polyuria, polydipsia, no heat or cold intolerance. No recent change in weight. HEMATOLOGICAL: No anemia or easy bruising or bleeding. NEUROLOGIC: No headache, seizures, numbness, tingling or weakness. PSYCHIATRIC: No depression, no loss of interest in normal activity or change in sleep pattern.     Exam:   BP 110/70    Pulse 74   Ht 5' 4.25" (1.632 m)   Wt 178 lb (80.7 kg)   LMP 08/17/2018 (Approximate) Comment: spotting  SpO2 99%   BMI 30.32 kg/m   Body mass index is 30.32 kg/m.  General appearance : Well developed well nourished female. No acute distress HEENT: Eyes: no retinal hemorrhage or exudates,  Neck supple, trachea midline, no carotid bruits, no thyroidmegaly Lungs: Clear to auscultation, no rhonchi or wheezes, or rib retractions  Heart: Regular rate and rhythm, no murmurs or gallops Breast:Examined in sitting and supine position were symmetrical in appearance, no palpable masses or tenderness,  no skin retraction, no nipple inversion, no nipple discharge, no skin discoloration, no axillary or supraclavicular lymphadenopathy Abdomen: no palpable masses or tenderness, no rebound or guarding Extremities: no edema or skin discoloration or tenderness  Pelvic: Vulva: Normal             Vagina: No gross lesions or discharge  Cervix/Uterus absent  Adnexa  Without masses or tenderness  Anus: Normal   Assessment/Plan:  41 y.o. female for annual exam   1. Well female exam with routine gynecological exam S/P TAH/Bilateral Salpingectomy on 09/12/2018, benign.  Pap Neg 07/2018.  No h/o abnormal Pap.  Will repeat Pap tests at 5 yrs intervals, next in 2025.  No pain with intercourse.  Low libido.  On Lexapro for anxiety.  Breasts Normal.  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 Urine and bowel movements normal.  Body mass index 30.32. Good fitness.  Health labs with PA.  2. S/P TAH (total abdominal hysterectomy)  3. Low libido  Addyi discussed.  On Lexapro for anxiety.  Will consider changing to an anti-anxiety medication PRN.  Communication on sex with her husband.  If no improvement, will f/u to consider Addyi treatment.  [name] MD, 10:31 AM 11/11/2021

## 2021-12-04 ENCOUNTER — Encounter: Payer: Self-pay | Admitting: Nurse Practitioner

## 2021-12-04 ENCOUNTER — Ambulatory Visit: Payer: Commercial Managed Care - PPO | Admitting: Nurse Practitioner

## 2021-12-04 VITALS — BP 122/76 | HR 76 | Temp 97.4°F | Ht 64.0 in | Wt 182.2 lb

## 2021-12-04 DIAGNOSIS — F339 Major depressive disorder, recurrent, unspecified: Secondary | ICD-10-CM | POA: Diagnosis not present

## 2021-12-04 DIAGNOSIS — Z111 Encounter for screening for respiratory tuberculosis: Secondary | ICD-10-CM

## 2021-12-04 DIAGNOSIS — Z23 Encounter for immunization: Secondary | ICD-10-CM | POA: Diagnosis not present

## 2021-12-04 NOTE — Assessment & Plan Note (Signed)
Stable mood with lexapro, trazodone and adderall. She is under the care of Dr. Humberto Seals

## 2021-12-04 NOTE — Progress Notes (Signed)
Established Patient Visit  Patient: Donna Ritter   DOB: Apr 05, 1980   41 y.o. Female  MRN: [id number] Visit Date: 12/04/2021  Subjective:    Chief Complaint  Patient presents with   office visit    Wants Tb blood Test for school  Flu Vaccine Requesting records for pap   HPI Ms. Couglin needs repeat quantiferon test for school clinical. She denies any known exposure or travel out of the country or incarceration, no cough or chest pain or fever or night sweats or SOB or unintentional weight loss or swollen lymph nodes.  Chronic major depressive disorder, recurrent episode (H[facility]) Stable mood with lexapro, trazodone and adderall. She is under the care of Dr. Humberto Seals      12/04/2021    1:55 PM 12/28/2019    8:11 AM  Depression screen PHQ 2/9  Decreased Interest 0 0  Down, Depressed, Hopeless 0 0  PHQ - 2 Score 0 0  Altered sleeping 0   Tired, decreased energy 0   Change in appetite 1   Feeling bad or failure about yourself  0   Trouble concentrating 0   Moving slowly or fidgety/restless 0   Suicidal thoughts 0   PHQ-9 Score 1   Difficult doing work/chores Not difficult at all        12/04/2021    1:56 PM  GAD 7 : Generalized Anxiety Score  Nervous, Anxious, on Edge 0  Control/stop worrying 0  Worry too much - different things 0  Trouble relaxing 0  Restless 0  Easily annoyed or irritable 1  Afraid - awful might happen 0  Total GAD 7 Score 1  Anxiety Difficulty Not difficult at all   Reviewed medical, surgical, and social history today  Medications: Outpatient Medications Prior to Visit  Medication Sig   amphetamine-dextroamphetamine (ADDERALL) 20 MG tablet Take 20 mg by mouth 4 (four) times daily.   cetirizine (ZYRTEC) 10 MG tablet Take 1 tablet (10 mg total) by mouth 2 (two) times daily.   escitalopram (LEXAPRO) 20 MG tablet Take 20 mg by mouth daily.   ibuprofen (ADVIL) 800 MG tablet TAKE 1 TABLET(800 MG) BY MOUTH EVERY 8 HOURS AS NEEDED    Multiple Vitamin (MULTIVITAMIN PO) Take by mouth.   omeprazole (PRILOSEC) 40 MG capsule Take 1 capsule (40 mg total) by mouth daily.   traZODone (DESYREL) 50 MG tablet Take 50-150 mg by mouth at bedtime as needed for sleep.    No facility-administered medications prior to visit.   Reviewed past medical and social history.   ROS per HPI above      Objective:  BP 122/76 (BP Location: Right Arm, Patient Position: Sitting, Cuff Size: Normal)   Pulse 76   Temp (!) 97.4 F (36.3 C) (Temporal)   Ht '5\' 4"'$  (1.626 m)   Wt 182 lb 3.2 oz (82.6 kg)   LMP 08/17/2018 (Approximate) Comment: spotting  SpO2 97%   BMI 31.27 kg/m      Physical Exam Cardiovascular:     Rate and Rhythm: Normal rate and regular rhythm.     Pulses: Normal pulses.     Heart sounds: Normal heart sounds.  Pulmonary:     Effort: Pulmonary effort is normal.     Breath sounds: Normal breath sounds.  Lymphadenopathy:     Cervical: No cervical adenopathy.  Neurological:     Mental Status: She is alert and oriented to person,  place, and time.     No results found for any visits on 12/04/21.    Assessment & Plan:    Problem List Items Addressed This Visit       Other   Chronic major depressive disorder, recurrent episode ([place])   Other Visit Diagnoses     Screening-pulmonary TB    -  Primary   Relevant Orders   QuantiFERON-TB Gold Plus   Need for immunization against influenza       Relevant Orders   Flu Vaccine QUAD 6+ mos PF IM (Fluarix Quad PF) (Completed)      No follow-ups on file.     Wilfred Lacy, NP

## 2021-12-04 NOTE — Patient Instructions (Signed)
Go to lab

## 2021-12-08 ENCOUNTER — Encounter: Payer: Self-pay | Admitting: Nurse Practitioner

## 2021-12-08 LAB — QUANTIFERON-TB GOLD PLUS
Mitogen-NIL: >10 IU/mL
NIL: 0.04 IU/mL
QuantiFERON-TB Gold Plus: NEGATIVE
TB1-NIL: 0 IU/mL
TB2-NIL: 0.01 IU/mL

## 2022-01-20 ENCOUNTER — Encounter: Payer: Self-pay | Admitting: Nurse Practitioner

## 2022-01-20 ENCOUNTER — Other Ambulatory Visit: Payer: Self-pay | Admitting: Nurse Practitioner

## 2022-01-20 ENCOUNTER — Ambulatory Visit (INDEPENDENT_AMBULATORY_CARE_PROVIDER_SITE_OTHER): Payer: Commercial Managed Care - PPO | Admitting: Nurse Practitioner

## 2022-01-20 VITALS — BP 112/62 | HR 62 | Temp 96.2°F | Ht 64.0 in | Wt 182.8 lb

## 2022-01-20 DIAGNOSIS — E559 Vitamin D deficiency, unspecified: Secondary | ICD-10-CM

## 2022-01-20 DIAGNOSIS — K219 Gastro-esophageal reflux disease without esophagitis: Secondary | ICD-10-CM

## 2022-01-20 DIAGNOSIS — E781 Pure hyperglyceridemia: Secondary | ICD-10-CM

## 2022-01-20 DIAGNOSIS — L503 Dermatographic urticaria: Secondary | ICD-10-CM

## 2022-01-20 DIAGNOSIS — Z0001 Encounter for general adult medical examination with abnormal findings: Secondary | ICD-10-CM

## 2022-01-20 LAB — COMPREHENSIVE METABOLIC PANEL
ALT: 14 U/L (ref 0–35)
AST: 14 U/L (ref 0–37)
Albumin: 3.8 g/dL (ref 3.5–5.2)
Alkaline Phosphatase: 75 U/L (ref 39–117)
BUN: 15 mg/dL (ref 6–23)
CO2: 31 mEq/L (ref 19–32)
Calcium: 8.8 mg/dL (ref 8.4–10.5)
Chloride: 105 mEq/L (ref 96–112)
Creatinine, Ser: 0.85 mg/dL (ref 0.40–1.20)
GFR: 86.39 mL/min (ref >60.00)
Glucose, Bld: 100 mg/dL — ABNORMAL HIGH (ref 70–99)
Potassium: 4.3 mEq/L (ref 3.5–5.1)
Sodium: 139 mEq/L (ref 135–145)
Total Bilirubin: 0.4 mg/dL (ref 0.2–1.2)
Total Protein: 6 g/dL (ref 6.0–8.3)

## 2022-01-20 LAB — LIPID PANEL
Cholesterol: 195 mg/dL (ref 0–200)
HDL: 57.9 mg/dL (ref >39.00)
LDL Cholesterol: 122 mg/dL — ABNORMAL HIGH (ref 0–99)
NonHDL: 136.89
Total CHOL/HDL Ratio: 3
Triglycerides: 75 mg/dL (ref 0.0–149.0)
VLDL: 15 mg/dL (ref 0.0–40.0)

## 2022-01-20 LAB — VITAMIN D 25 HYDROXY (VIT D DEFICIENCY, FRACTURES): VITD: 24.89 ng/mL — ABNORMAL LOW (ref 30.00–100.00)

## 2022-01-20 MED ORDER — VITAMIN D (ERGOCALCIFEROL) 1.25 MG (50000 UNIT) PO CAPS: 50000.0000 [IU] | ORAL_CAPSULE | ORAL | 1 refills | Status: DC

## 2022-01-20 NOTE — Patient Instructions (Signed)
Go to lab  Preventive Care 21-39 Years Old, Female Preventive care refers to lifestyle choices and visits with your health care provider that can promote health and wellness. Preventive care visits are also called wellness exams. What can I expect for my preventive care visit? Counseling During your preventive care visit, your health care provider may ask about your: Medical history, including: Past medical problems. Family medical history. Pregnancy history. Current health, including: Menstrual cycle. Method of birth control. Emotional well-being. Home life and relationship well-being. Sexual activity and sexual health. Lifestyle, including: Alcohol, nicotine or tobacco, and drug use. Access to firearms. Diet, exercise, and sleep habits. Work and work environment. Sunscreen use. Safety issues such as seatbelt and bike helmet use. Physical exam Your health care provider may check your: Height and weight. These may be used to calculate your BMI (body mass index). BMI is a measurement that tells if you are at a healthy weight. Waist circumference. This measures the distance around your waistline. This measurement also tells if you are at a healthy weight and may help predict your risk of certain diseases, such as type 2 diabetes and high blood pressure. Heart rate and blood pressure. Body temperature. Skin for abnormal spots. What immunizations do I need?  Vaccines are usually given at various ages, according to a schedule. Your health care provider will recommend vaccines for you based on your age, medical history, and lifestyle or other factors, such as travel or where you work. What tests do I need? Screening Your health care provider may recommend screening tests for certain conditions. This may include: Pelvic exam and Pap test. Lipid and cholesterol levels. Diabetes screening. This is done by checking your blood sugar (glucose) after you have not eaten for a while  (fasting). Hepatitis B test. Hepatitis C test. HIV (human immunodeficiency virus) test. STI (sexually transmitted infection) testing, if you are at risk. BRCA-related cancer screening. This may be done if you have a family history of breast, ovarian, tubal, or peritoneal cancers. Talk with your health care provider about your test results, treatment options, and if necessary, the need for more tests. Follow these instructions at home: Eating and drinking  Eat a healthy diet that includes fresh fruits and vegetables, whole grains, lean protein, and low-fat dairy products. Take vitamin and mineral supplements as recommended by your health care provider. Do not drink alcohol if: Your health care provider tells you not to drink. You are pregnant, may be pregnant, or are planning to become pregnant. If you drink alcohol: Limit how much you have to 0-1 drink a day. Know how much alcohol is in your drink. In the [place] one drink equals one 12 oz bottle of beer (355 mL), one 5 oz glass of wine (148 mL), or one 1 oz glass of hard liquor (44 mL). Lifestyle Brush your teeth every morning and night with fluoride toothpaste. Floss one time each day. Exercise for at least 30 minutes 5 or more days each week. Do not use any products that contain nicotine or tobacco. These products include cigarettes, chewing tobacco, and vaping devices, such as e-cigarettes. If you need help quitting, ask your health care provider. Do not use drugs. If you are sexually active, practice safe sex. Use a condom or other form of protection to prevent STIs. If you do not wish to become pregnant, use a form of birth control. If you plan to become pregnant, see your health care provider for a prepregnancy visit. Find healthy ways to manage   stress, such as: Meditation, yoga, or listening to music. Journaling. Talking to a trusted person. Spending time with friends and family. Minimize exposure to UV radiation to reduce your  risk of skin cancer. Safety Always wear your seat belt while driving or riding in a vehicle. Do not drive: If you have been drinking alcohol. Do not ride with someone who has been drinking. If you have been using any mind-altering substances or drugs. While texting. When you are tired or distracted. Wear a helmet and other protective equipment during sports activities. If you have firearms in your house, make sure you follow all gun safety procedures. Seek help if you have been physically or sexually abused. What's next? Go to your health care provider once a year for an annual wellness visit. Ask your health care provider how often you should have your eyes and teeth checked. Stay up to date on all vaccines. This information is not intended to replace advice given to you by your health care provider. Make sure you discuss any questions you have with your health care provider. Document Revised: 08/14/2020 Document Reviewed: 08/14/2020 Elsevier Patient Education  2023 Elsevier Inc.  

## 2022-01-20 NOTE — Addendum Note (Signed)
Addended by: Wilfred Lacy L on: 01/20/2022 04:09 PM   Modules accepted: Orders

## 2022-01-20 NOTE — Progress Notes (Signed)
Complete physical exam  Patient: Donna Ritter   DOB: 03/05/1980   41 y.o. Female  MRN: [id number] Visit Date: 01/20/2022  Subjective:    Chief Complaint  Patient presents with   Annual Exam    CPE Pt fasting Wants A1C checked  No concerns     Donna Ritter is a 41 y.o. female who presents today for a complete physical exam. She reports consuming a general diet.  Walking, count 11000steps per day  She generally feels well. She reports sleeping well. She does not have additional problems to discuss today.  Vision:Yes Dental:Yes STD Screen:No  Most recent fall risk assessment:    01/17/2021    1:03 PM  [place] in the past year? 1  Number falls in past yr: 0  Injury with Fall? 1  Risk for fall due to : History of fall(s)  Follow up Falls evaluation completed     Most recent depression screenings:    01/20/2022    9:15 AM 12/04/2021    1:55 PM  PHQ 2/9 Scores  PHQ - 2 Score 0 0  PHQ- 9 Score 0 1    HPI  No problem-specific Assessment & Plan notes found for this encounter.   Past Medical History:  Diagnosis Date   Anemia    Anxiety    Past Surgical History:  Procedure Laterality Date   CESAREAN SECTION     x2   HYSTERECTOMY ABDOMINAL WITH SALPINGECTOMY Bilateral 09/12/2018   Procedure: HYSTERECTOMY ABDOMINAL WITH SALPINGECTOMY;  Surgeon: [name], MD;  Location: [facility];  Service: Gynecology;  Laterality: Bilateral;  case is for Dr. Dellis Filbert Request to move 7:30 case ( In [facility]yn block) already scheduled  to follow this case. Request 2 hours.   Social History   Socioeconomic History   Marital status: Married    Spouse name: Not on file   Number of children: 2   Years of education: Not on file   Highest education level: Not on file  Occupational History   Not on file  Tobacco Use   Smoking status: Every Day    Packs/day: 0.50    Types: Cigarettes   Smokeless tobacco: Never  Vaping Use   Vaping  Use: Never used  Substance and Sexual Activity   Alcohol use: Yes    Comment: occ   Drug use: Never   Sexual activity: Yes    Partners: Male    Birth control/protection: Surgical    Comment: 1st intercourse- 64, partners- more than 5, hysterectomy  Other Topics Concern   Not on file  Social History Narrative   [employer]   Social Determinants of Health   Financial Resource Strain: Not on file  Food Insecurity: Not on file  Transportation Needs: Not on file  Physical Activity: Not on file  Stress: Not on file  Social Connections: Not on file  Intimate Partner Violence: Not on file   Family Status  Relation Name Status   Mother  (Not Specified)   Brother  (Not Specified)   Annamarie Major  (Not Specified)   MGM  (Not Specified)   MGF  (Not Specified)   PGF  (Not Specified)   Family History  Problem Relation Age of Onset   Diabetes Mother    Cancer Mother        lung?   Autism Brother    Hypertension Paternal Uncle    Heart attack Maternal Grandmother  Cancer Maternal Grandfather        leukemia   Hypertension Paternal Grandfather    Heart attack Paternal Grandfather    Diabetes Paternal Grandfather    No Known Allergies  Patient Care Team: Ebenezer Mccaskey, Charlene Brooke, NP as PCP - [facility]   Medications: Outpatient Medications Prior to Visit  Medication Sig   amphetamine-dextroamphetamine (ADDERALL) 20 MG tablet Take 20 mg by mouth 4 (four) times daily.   cetirizine (ZYRTEC) 10 MG tablet Take 1 tablet (10 mg total) by mouth 2 (two) times daily.   escitalopram (LEXAPRO) 20 MG tablet Take 20 mg by mouth daily.   ibuprofen (ADVIL) 800 MG tablet TAKE 1 TABLET(800 MG) BY MOUTH EVERY 8 HOURS AS NEEDED   Multiple Vitamin (MULTIVITAMIN PO) Take by mouth.   omeprazole (PRILOSEC) 40 MG capsule Take 1 capsule (40 mg total) by mouth daily.   traZODone (DESYREL) 50 MG tablet Take 50-150 mg by mouth at bedtime as needed for  sleep.    No facility-administered medications prior to visit.    Review of Systems  Constitutional:  Negative for fever.  HENT:  Negative for congestion and sore throat.   Eyes:        Negative for visual changes  Respiratory:  Negative for cough and shortness of breath.   Cardiovascular:  Negative for chest pain, palpitations and leg swelling.  Gastrointestinal:  Negative for blood in stool, constipation and diarrhea.  Genitourinary:  Negative for dysuria, frequency and urgency.  Musculoskeletal:  Negative for myalgias.  Skin:  Negative for rash.  Neurological:  Negative for dizziness and headaches.  Hematological:  Does not bruise/bleed easily.  Psychiatric/Behavioral:  Negative for suicidal ideas. The patient is not nervous/anxious.         Objective:  BP 112/62 (BP Location: Right Arm, Patient Position: Sitting, Cuff Size: Normal)   Pulse 62   Temp (!) 96.2 F (35.7 C) (Temporal)   Ht _0  (1.626 m)   Wt 182 lb 12.8 oz (82.9 kg)   LMP 08/17/2018 (Approximate) Comment: spotting  SpO2 97%   BMI 31.38 kg/m       Physical Exam Constitutional:      General: She is not in acute distress. HENT:     Right Ear: Tympanic membrane, ear canal and external ear normal.     Left Ear: Tympanic membrane, ear canal and external ear normal.     Nose: Nose normal.  Eyes:     General: No scleral icterus.    Extraocular Movements: Extraocular movements intact.     Conjunctiva/sclera: Conjunctivae normal.  Neck:     Thyroid: No thyromegaly.  Cardiovascular:     Rate and Rhythm: Normal rate and regular rhythm.     Pulses: Normal pulses.     Heart sounds: Normal heart sounds.  Pulmonary:     Effort: Pulmonary effort is normal.     Breath sounds: Normal breath sounds.  Chest:     Chest wall: No tenderness.  Abdominal:     General: Bowel sounds are normal. There is no distension.     Palpations: Abdomen is soft.     Tenderness: There is no abdominal tenderness.   Musculoskeletal:        General: No tenderness. Normal range of motion.     Cervical back: Normal range of motion and neck supple.     Right lower leg: No edema.     Left lower leg: No edema.  Lymphadenopathy:  Cervical: No cervical adenopathy.  Skin:    General: Skin is warm and dry.  Neurological:     Mental Status: She is alert and oriented to person, place, and time.  Psychiatric:        Mood and Affect: Mood normal.        Behavior: Behavior normal.        Thought Content: Thought content normal.        Judgment: Judgment normal.      No results found for any visits on 01/20/22.    Assessment & Plan:    Routine Health Maintenance and Physical Exam  Immunization History  Administered Date(s) Administered   Influenza,inj,Quad PF,6+ Mos 12/04/2021   Influenza-Unspecified 11/14/2018, 12/28/2019, 12/27/2020   MMR 09/24/2016   Moderna Sars-Covid-2 Vaccination 05/23/2019, 06/20/2019   PFIZER(Purple Top)SARS-COV-2 Vaccination 12/27/2020   Tdap 03/02/2008, 09/30/2020    Health Maintenance  Topic Date Due   COVID-19 Vaccine (4 - 2023-24 season) 02/05/2022 (Originally 10/31/2021)   Hepatitis C Screening  01/21/2023 (Originally 10/13/2000)   HIV Screening  01/21/2023 (Originally 10/13/1997)   PAP SMEAR-Modifier  11/11/2024   INFLUENZA VACCINE  Completed   HPV VACCINES  Aged Out    Discussed health benefits of physical activity, and encouraged her to engage in regular exercise appropriate for her age and condition.  Problem List Items Addressed This Visit       Other   Hypertriglyceridemia   Relevant Orders   Lipid panel   Vitamin D deficiency   Relevant Orders   Vitamin D (25 hydroxy)   Other Visit Diagnoses     Encounter for preventative adult health care exam with abnormal findings    -  Primary   Relevant Orders   Comprehensive metabolic panel      Return in about 1 year (around 01/21/2023) for CPE (fasting).     Wilfred Lacy, NP

## 2022-01-20 NOTE — Telephone Encounter (Signed)
Chart supports Rx Last OV: 12/2021 Next OV: not scheduled

## 2022-06-17 IMAGING — DX DG HAND COMPLETE 3+V*R*
3 series · 3 of 3 positions shown · non-contrast
Comparison: None.

CLINICAL DATA: Trauma to the right hand.

EXAM:
RIGHT HAND - COMPLETE 3+ VIEW

[hand pa]
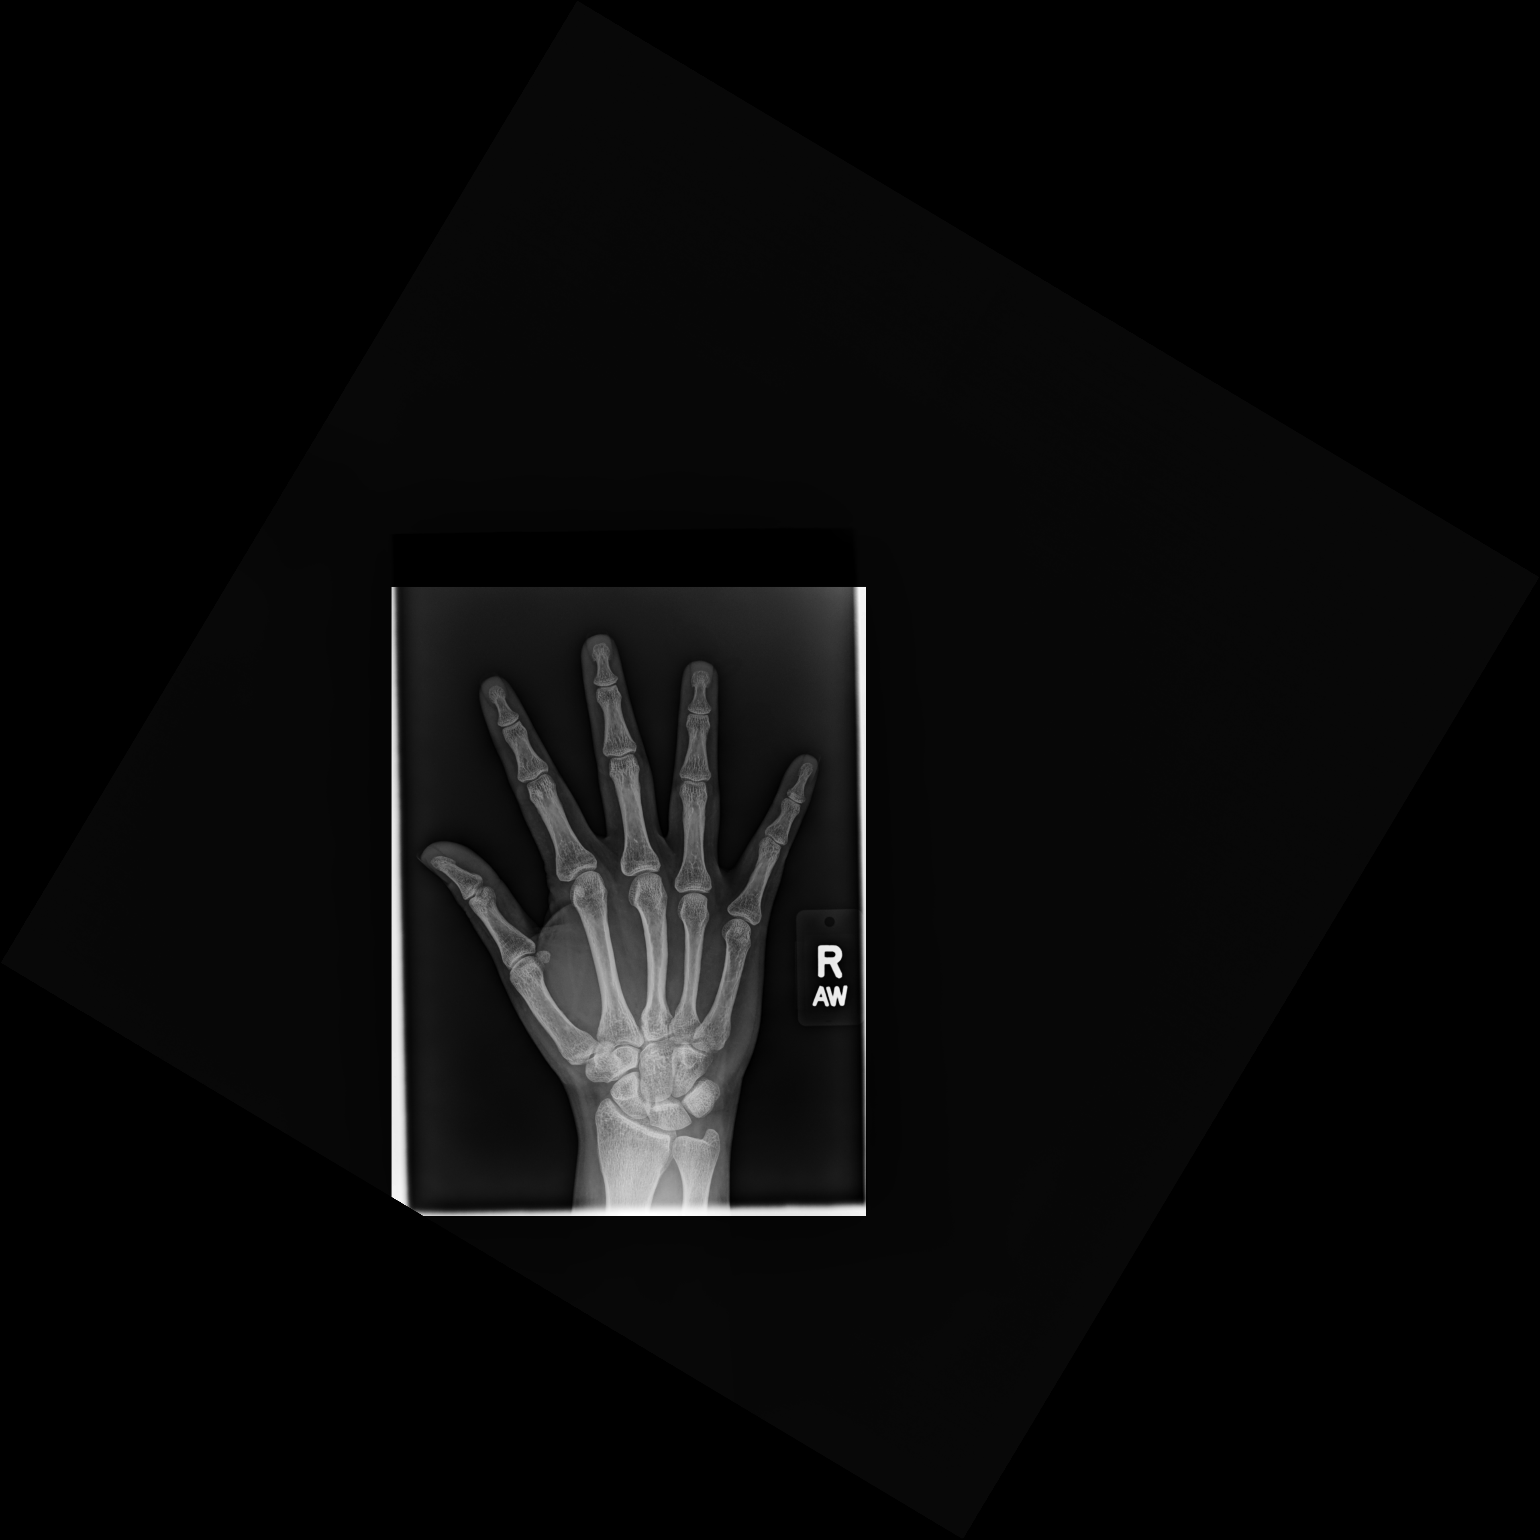

[hand lateral]
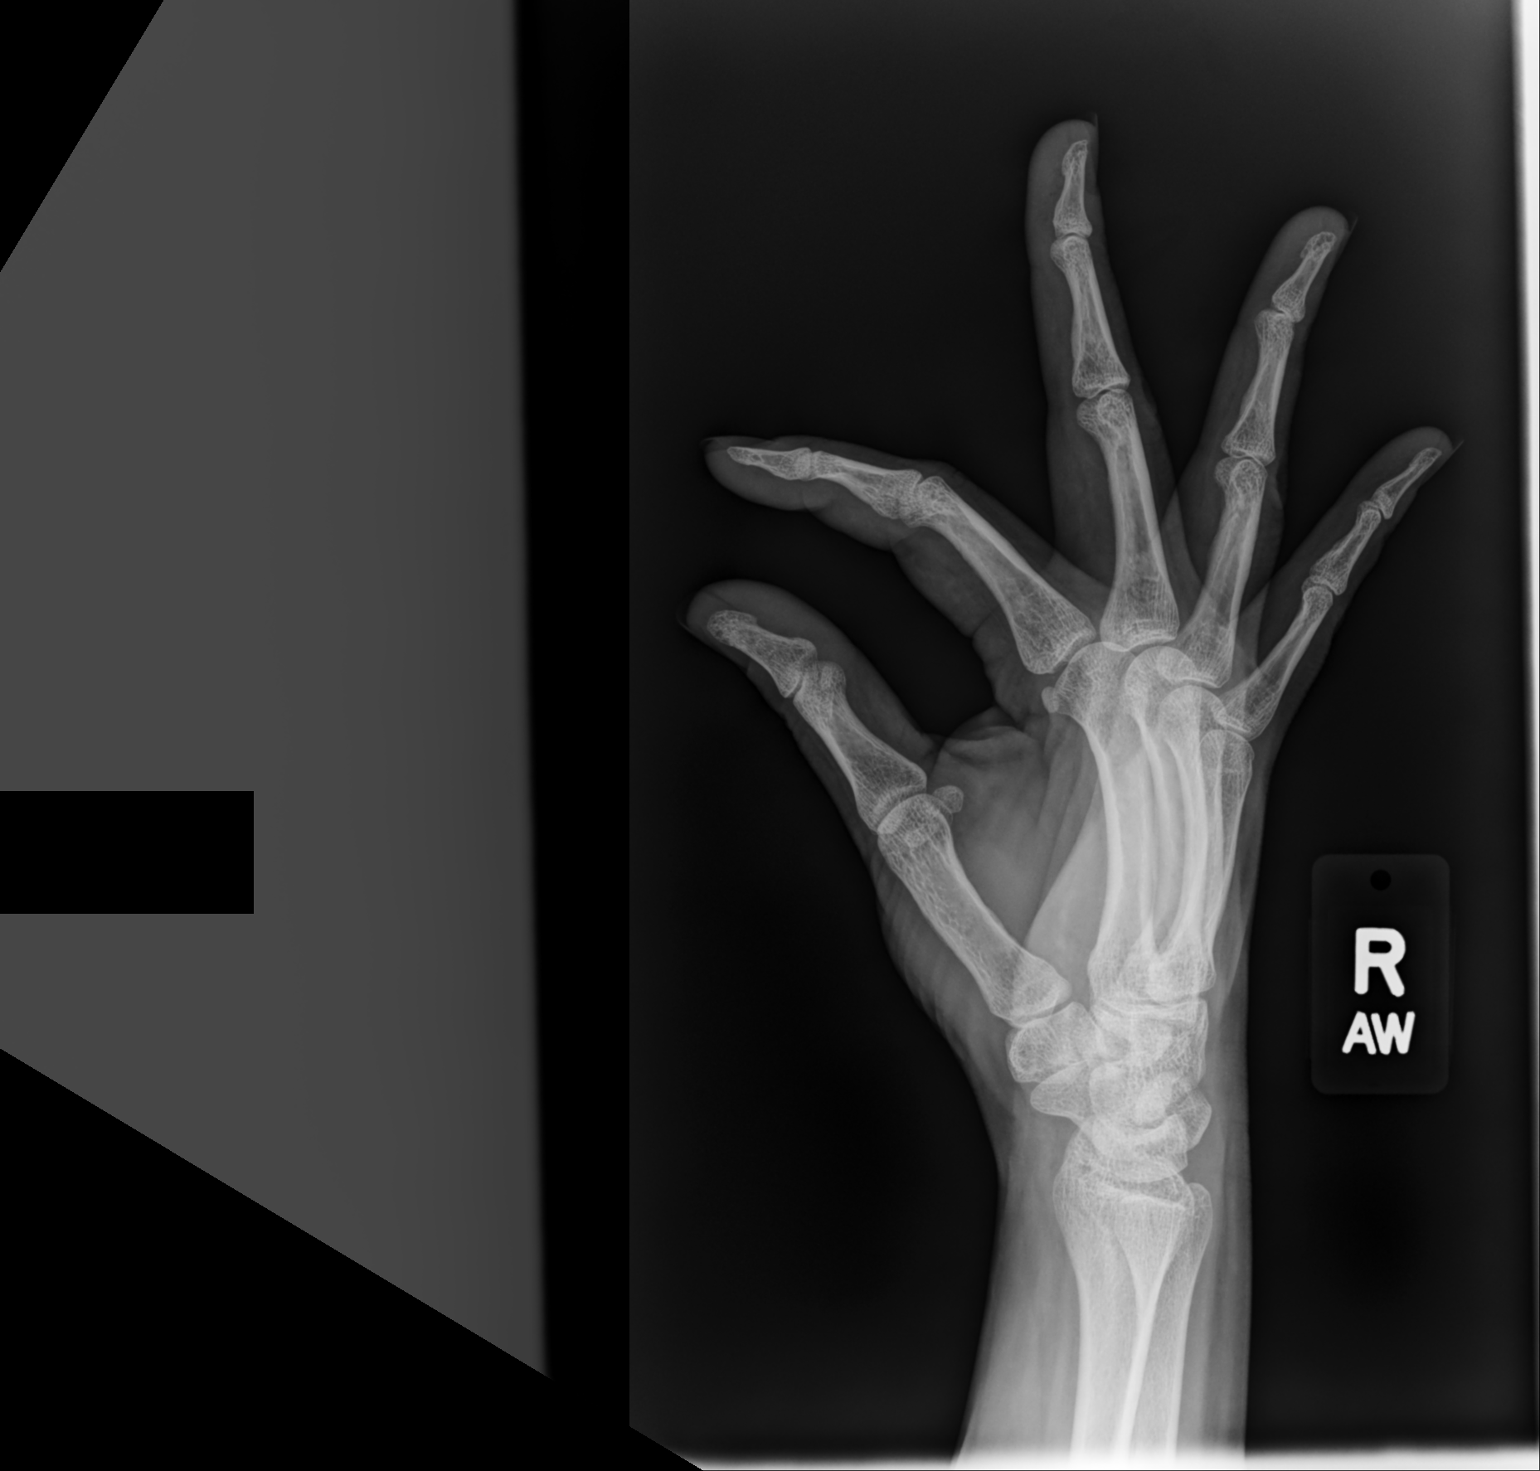

[hand mlo]
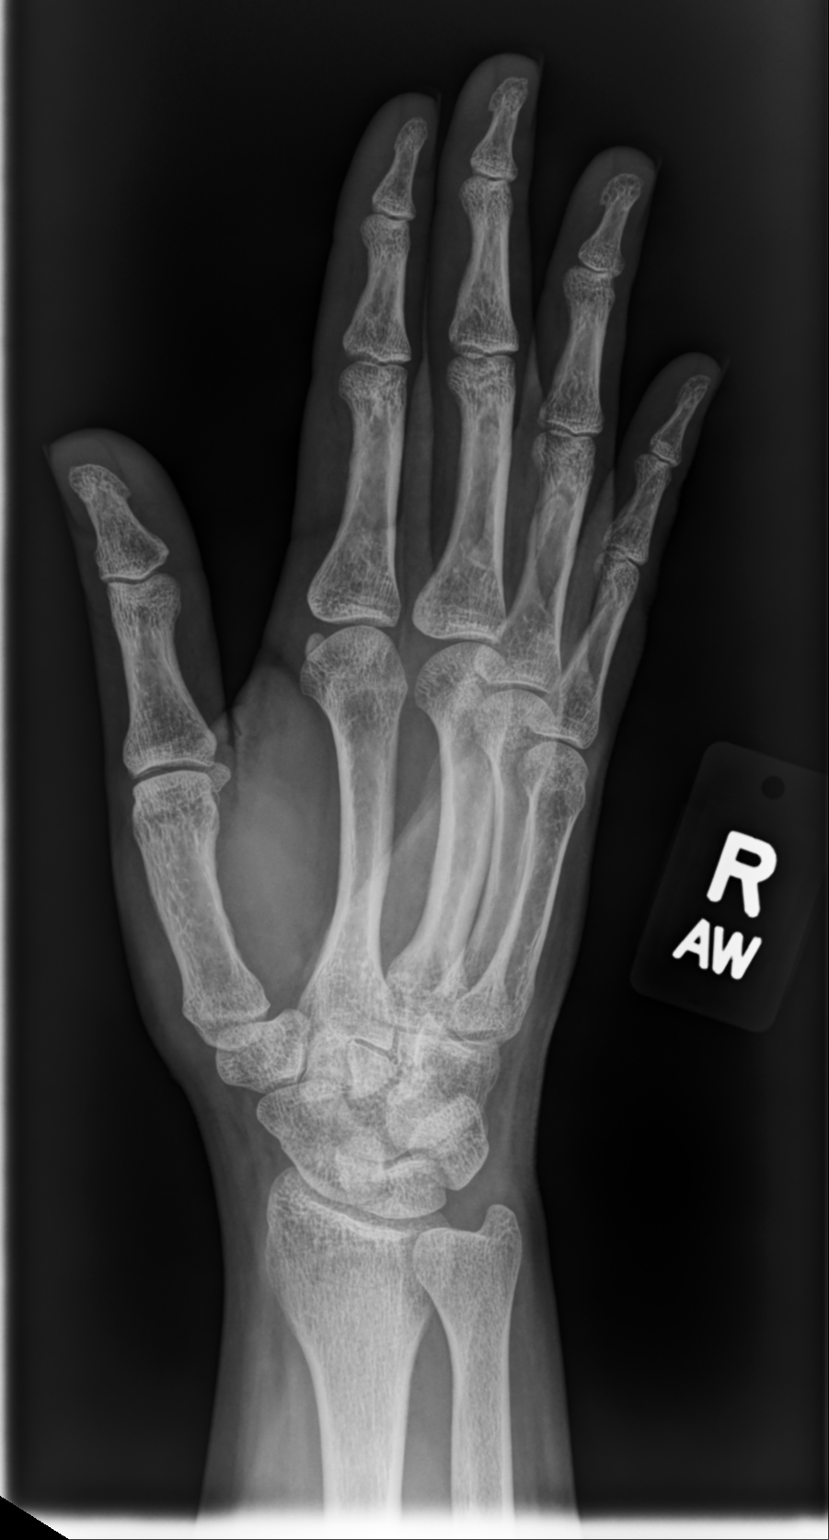

[3 of 3 positions shown; findings below may reference images not displayed]

FINDINGS: No acute fracture or dislocation. Mild osteopenia. The soft tissues
are unremarkable.
IMPRESSION: No acute findings.

## 2022-08-06 ENCOUNTER — Other Ambulatory Visit: Payer: Self-pay | Admitting: Nurse Practitioner

## 2022-08-06 DIAGNOSIS — E559 Vitamin D deficiency, unspecified: Secondary | ICD-10-CM

## 2022-08-06 NOTE — Telephone Encounter (Signed)
Patient informed of the message below and voiced understanding  

## 2022-09-24 ENCOUNTER — Other Ambulatory Visit: Payer: Self-pay | Admitting: Nurse Practitioner

## 2022-09-24 DIAGNOSIS — Z1231 Encounter for screening mammogram for malignant neoplasm of breast: Secondary | ICD-10-CM

## 2022-10-15 ENCOUNTER — Ambulatory Visit
Admission: RE | Admit: 2022-10-15 | Discharge: 2022-10-15 | Disposition: A | Discharge status: Home / Self Care | Payer: Commercial Managed Care - PPO | Source: Ambulatory Visit | Attending: Nurse Practitioner | Admitting: Nurse Practitioner

## 2022-10-15 DIAGNOSIS — Z1231 Encounter for screening mammogram for malignant neoplasm of breast: Secondary | ICD-10-CM

## 2022-11-13 ENCOUNTER — Ambulatory Visit: Payer: Commercial Managed Care - PPO | Admitting: Obstetrics & Gynecology

## 2022-11-16 ENCOUNTER — Encounter: Payer: Self-pay | Admitting: Nurse Practitioner

## 2022-11-16 ENCOUNTER — Ambulatory Visit (INDEPENDENT_AMBULATORY_CARE_PROVIDER_SITE_OTHER): Payer: Commercial Managed Care - PPO | Admitting: Nurse Practitioner

## 2022-11-16 VITALS — BP 112/72 | HR 80 | Ht 64.25 in | Wt 213.0 lb

## 2022-11-16 DIAGNOSIS — Z01419 Encounter for gynecological examination (general) (routine) without abnormal findings: Secondary | ICD-10-CM | POA: Diagnosis not present

## 2022-11-16 NOTE — Progress Notes (Signed)
Donna Ritter 1981/01/16 [id number]   History:  42 y.o. G2P2002 presents for annual exam. S/P 2020 TAH with bilateral salpingectomies for fibroids. Normal pap history.   Gynecologic History Patient's last menstrual period was 08/17/2018 (approximate).   Contraception/Family planning: status post hysterectomy Sexually active: Yes  Health Maintenance Last Pap: 07/20/2018. Results were: Normal Last mammogram: 10/15/2022. Results were: Normal Last colonoscopy: Never Last Dexa: Not indicated  Past medical history, past surgical history, family history and social history were all reviewed and documented in the EPIC chart. Married. 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 14 and 32 yo children.   ROS:  A ROS was performed and pertinent positives and negatives are included.  Exam:  Vitals:   11/16/22 1158  BP: 112/72  Pulse: 80  SpO2: 96%  Weight: 213 lb (96.6 kg)  Height: 5' 4.25" (1.632 m)   Body mass index is 36.28 kg/m.  General appearance:  Normal Thyroid:  Symmetrical, normal in size, without palpable masses or nodularity. Respiratory  Auscultation:  Clear without wheezing or rhonchi Cardiovascular  Auscultation:  Regular rate, without rubs, murmurs or gallops  Edema/varicosities:  Not grossly evident Abdominal  Soft,nontender, without masses, guarding or rebound.  Liver/spleen:  No organomegaly noted  Hernia:  None appreciated  Skin  Inspection:  Grossly normal Breasts: Examined lying and sitting.   Right: Without masses, retractions, nipple discharge or axillary adenopathy.   Left: Without masses, retractions, nipple discharge or axillary adenopathy. Pelvic: External genitalia:  no lesions              Urethra:  normal appearing urethra with no masses, tenderness or lesions              Bartholins and Skenes: normal                 Vagina: normal appearing vagina with normal color and discharge, no  lesions              Cervix: Absent Bimanual Exam:  Uterus:  Absent              Adnexa: no mass, fullness, tenderness              Rectovaginal: Deferred              Anus:  normal sphincter tone, no lesions   Patient informed chaperone available to be present for breast and pelvic exam. Patient has requested no chaperone to be present. Patient has been advised what will be completed during breast and pelvic exam.   Assessment/Plan:  42 y.o. [id number] for annual exam.   Well female exam with routine gynecological exam - Education provided on SBEs, importance of preventative screenings, current guidelines, high calcium diet, regular exercise, and multivitamin daily.  Labs with PCP.   Screening for cervical cancer - Normal Pap history. S/P TAH. No longer screening per guidelines.   Screening for breast cancer - Normal mammogram history.  Continue annual screenings.  Normal breast exam today.  Return in 1 year for annual or sooner if needed.     Olivia Mackie DNP, 12:15 PM 11/16/2022

## 2023-01-22 ENCOUNTER — Ambulatory Visit (INDEPENDENT_AMBULATORY_CARE_PROVIDER_SITE_OTHER): Payer: Commercial Managed Care - PPO | Admitting: Nurse Practitioner

## 2023-01-22 ENCOUNTER — Encounter: Payer: Self-pay | Admitting: Nurse Practitioner

## 2023-01-22 VITALS — BP 100/62 | HR 73 | Temp 98.3°F | Resp 18 | Ht 64.5 in | Wt 216.4 lb

## 2023-01-22 DIAGNOSIS — Z0001 Encounter for general adult medical examination with abnormal findings: Secondary | ICD-10-CM | POA: Diagnosis not present

## 2023-01-22 DIAGNOSIS — E781 Pure hyperglyceridemia: Secondary | ICD-10-CM | POA: Diagnosis not present

## 2023-01-22 DIAGNOSIS — F3342 Major depressive disorder, recurrent, in full remission: Secondary | ICD-10-CM | POA: Diagnosis not present

## 2023-01-22 DIAGNOSIS — E559 Vitamin D deficiency, unspecified: Secondary | ICD-10-CM | POA: Diagnosis not present

## 2023-01-22 DIAGNOSIS — K219 Gastro-esophageal reflux disease without esophagitis: Secondary | ICD-10-CM

## 2023-01-22 LAB — CBC
HCT: 40.9 % (ref 36.0–46.0)
Hemoglobin: 13.5 g/dL (ref 12.0–15.0)
MCHC: 33.1 g/dL (ref 30.0–36.0)
MCV: 93.4 fL (ref 78.0–100.0)
Platelets: 241 10*3/uL (ref 150.0–400.0)
RBC: 4.38 Mil/uL (ref 3.87–5.11)
RDW: 13.3 % (ref 11.5–15.5)
WBC: 7.7 10*3/uL (ref 4.0–10.5)

## 2023-01-22 LAB — LIPID PANEL
Cholesterol: 179 mg/dL (ref 0–200)
HDL: 42.7 mg/dL (ref >39.00)
LDL Cholesterol: 110 mg/dL — ABNORMAL HIGH (ref 0–99)
NonHDL: 136.41
Total CHOL/HDL Ratio: 4
Triglycerides: 131 mg/dL (ref 0.0–149.0)
VLDL: 26.2 mg/dL (ref 0.0–40.0)

## 2023-01-22 LAB — COMPREHENSIVE METABOLIC PANEL
ALT: 18 U/L (ref 0–35)
AST: 19 U/L (ref 0–37)
Albumin: 3.8 g/dL (ref 3.5–5.2)
Alkaline Phosphatase: 97 U/L (ref 39–117)
BUN: 14 mg/dL (ref 6–23)
CO2: 27 meq/L (ref 19–32)
Calcium: 8.9 mg/dL (ref 8.4–10.5)
Chloride: 104 meq/L (ref 96–112)
Creatinine, Ser: 0.88 mg/dL (ref 0.40–1.20)
GFR: 82.29 mL/min (ref >60.00)
Glucose, Bld: 97 mg/dL (ref 70–99)
Potassium: 4.3 meq/L (ref 3.5–5.1)
Sodium: 137 meq/L (ref 135–145)
Total Bilirubin: 0.4 mg/dL (ref 0.2–1.2)
Total Protein: 6.5 g/dL (ref 6.0–8.3)

## 2023-01-22 LAB — VITAMIN D 25 HYDROXY (VIT D DEFICIENCY, FRACTURES): VITD: 24.03 ng/mL — ABNORMAL LOW (ref 30.00–100.00)

## 2023-01-22 MED ORDER — OMEPRAZOLE 40 MG PO CPDR: 40.0000 mg | DELAYED_RELEASE_CAPSULE | Freq: Every day | ORAL | 1 refills | Status: DC

## 2023-01-22 MED ORDER — FAMOTIDINE 40 MG PO TABS: 40.0000 mg | ORAL_TABLET | Freq: Every day | ORAL | 0 refills | Status: DC

## 2023-01-22 NOTE — Assessment & Plan Note (Addendum)
Persistent heart burn and belching despite use of omeprazole 40mg  daily Worse with food intake,  no ABDOMEN pain, No hoarsenes or dysphagia. No change in GI/GU function. Maintain omeprazole 40mg  in AM, add famotidine 40mg  in PM Entered GI referral

## 2023-01-22 NOTE — Assessment & Plan Note (Addendum)
Stable mood with lexapro and trazodone. under the care of Dr. Dutch Gray

## 2023-01-22 NOTE — Progress Notes (Signed)
Complete physical exam  Patient: Donna Ritter   DOB: [birth year]   42 y.o. Female  MRN: [id number] Visit Date: 01/22/2023  Subjective:    Chief Complaint  Patient presents with   Annual Exam    PT is here for annual exam    Donna Ritter is a 42 y.o. female who presents today for a complete physical exam. She reports consuming a general diet.  No exercise regimen  She generally feels well. She reports sleeping well. She does have additional problems to discuss today.  Vision:No Dental:Yes STD Screen:No BP Readings from Last 3 Encounters:  01/22/23 100/62  11/16/22 112/72  01/20/22 112/62   Wt Readings from Last 3 Encounters:  01/22/23 216 lb 6.4 oz (98.2 kg)  11/16/22 213 lb (96.6 kg)  01/20/22 182 lb 12.8 oz (82.9 kg)   Most recent fall risk assessment:    01/22/2023    8:18 AM  Fall Risk   Falls in the past year? 0  Number falls in past yr: 0  Injury with Fall? 0  Risk for fall due to : No Fall Risks  Follow up Falls evaluation completed   Depression screen:Yes - Depression Most recent depression screenings:    01/22/2023    8:19 AM 01/22/2023    8:18 AM  PHQ 2/9 Scores  PHQ - 2 Score 0 0  PHQ- 9 Score 0    HPI  GERD (gastroesophageal reflux disease) Persistent heart burn and belching despite use of omeprazole 40mg  daily Worse with food intake,  no ABDOMEN pain, No hoarsenes or dysphagia. No change in GI/GU function. Maintain omeprazole 40mg  in AM, add famotidine 40mg  in PM Entered GI referral  Chronic major depressive disorder, recurrent episode (HCC) Stable mood with lexapro and trazodone. under the care of Dr. Dutch Gray  Hypertriglyceridemia Repeat lipid panel Advised about importance for heart healthy diet, tobacco cessation and exercise  Vitamin D deficiency Repeat vit. D  Past Medical History:  Diagnosis Date   Anemia    Anxiety    Past Surgical History:  Procedure Laterality Date   CESAREAN SECTION     x2   HYSTERECTOMY  ABDOMINAL WITH SALPINGECTOMY Bilateral 09/12/2018   Procedure: HYSTERECTOMY ABDOMINAL WITH SALPINGECTOMY;  Surgeon: Genia Del, MD;  Location: [facility] [place];  Service: Gynecology;  Laterality: Bilateral;  case is for Dr. Seymour Bars Request to move 7:30 case ( In [facility] Gyn block) already scheduled  to follow this case. Request 2 hours.   Social History   Socioeconomic History   Marital status: Married    Spouse name: Not on file   Number of children: 2   Years of education: Not on file   Highest education level: Not on file  Occupational History   Not on file  Tobacco Use   Smoking status: Every Day    Current packs/day: 0.25    Average packs/day: 0.3 packs/day for 24.9 years (6.2 ttl pk-yrs)    Types: Cigarettes    Start date: 03/04/1998   Smokeless tobacco: Never  Vaping Use   Vaping status: Never Used  Substance and Sexual Activity   Alcohol use: Yes    Comment: occ-1-2drinks a month   Drug use: Never   Sexual activity: Yes    Partners: Male    Birth control/protection: Surgical    Comment: 1st intercourse- 17, partners- more than 5, hysterectomy  Other Topics Concern   Not on file  Social History Narrative   [employer] school   Social  Determinants of Health   Financial Resource Strain: Not on file  Food Insecurity: Not on file  Transportation Needs: Not on file  Physical Activity: Not on file  Stress: Not on file  Social Connections: Not on file  Intimate Partner Violence: Not on file   Family Status  Relation Name Status   Mother  (Not Specified)   Brother  (Not Specified)   Oneal Grout  (Not Specified)   MGM  (Not Specified)   MGF  (Not Specified)   PGF  (Not Specified)  No partnership data on file   Family History  Problem Relation Age of Onset   Diabetes Mother    Cancer Mother        lung?   Autism Brother    Hypertension Paternal Uncle    Heart attack Maternal Grandmother    Cancer Maternal Grandfather        leukemia    Hypertension Paternal Grandfather    Heart attack Paternal Grandfather    Diabetes Paternal Grandfather    No Known Allergies  Patient Care Team: Kiernan Atkerson, Bonna Gains, NP as PCP - General (Internal Medicine)   Medications: Outpatient Medications Prior to Visit  Medication Sig   cetirizine (ZYRTEC) 10 MG tablet TAKE 1 TABLET(10 MG) BY MOUTH TWICE DAILY   escitalopram (LEXAPRO) 20 MG tablet Take 20 mg by mouth daily.   Multiple Vitamin (MULTIVITAMIN PO) Take by mouth.   traZODone (DESYREL) 50 MG tablet Take 50-150 mg by mouth at bedtime as needed for sleep.    [DISCONTINUED] ibuprofen (ADVIL) 800 MG tablet TAKE 1 TABLET(800 MG) BY MOUTH EVERY 8 HOURS AS NEEDED   [DISCONTINUED] omeprazole (PRILOSEC) 40 MG capsule TAKE 1 CAPSULE(40 MG) BY MOUTH DAILY   No facility-administered medications prior to visit.   Review of Systems  Constitutional:  Negative for activity change, appetite change and unexpected weight change.  Respiratory: Negative.    Cardiovascular: Negative.   Gastrointestinal: Negative.   Endocrine: Negative for cold intolerance and heat intolerance.  Genitourinary: Negative.   Musculoskeletal: Negative.   Skin: Negative.   Neurological: Negative.   Hematological: Negative.   Psychiatric/Behavioral:  Negative for behavioral problems, decreased concentration, dysphoric mood, hallucinations, self-injury, sleep disturbance and suicidal ideas. The patient is not nervous/anxious.        Objective:  BP 100/62 (BP Location: Left Arm, Patient Position: Sitting, Cuff Size: Large)   Pulse 73   Temp 98.3 F (36.8 C) (Temporal)   Resp 18   Ht 5' 4.5" (1.638 m)   Wt 216 lb 6.4 oz (98.2 kg)   LMP 08/17/2018 (Approximate) Comment: spotting  SpO2 97%   BMI 36.57 kg/m     Physical Exam Vitals and nursing note reviewed.  Constitutional:      General: She is not in acute distress. HENT:     Right Ear: Tympanic membrane, ear canal and external ear normal.     Left Ear:  Tympanic membrane, ear canal and external ear normal.     Nose: Nose normal.  Eyes:     Extraocular Movements: Extraocular movements intact.     Conjunctiva/sclera: Conjunctivae normal.     Pupils: Pupils are equal, round, and reactive to light.  Neck:     Thyroid: No thyroid mass, thyromegaly or thyroid tenderness.  Cardiovascular:     Rate and Rhythm: Normal rate and regular rhythm.     Pulses: Normal pulses.     Heart sounds: Normal heart sounds.  Pulmonary:     Effort: Pulmonary  effort is normal.     Breath sounds: Normal breath sounds.  Abdominal:     General: Bowel sounds are normal. There is no distension.     Palpations: Abdomen is soft.     Tenderness: There is no abdominal tenderness. There is no guarding.  Musculoskeletal:        General: Normal range of motion.     Cervical back: Normal range of motion and neck supple.     Right lower leg: No edema.     Left lower leg: No edema.  Lymphadenopathy:     Cervical: No cervical adenopathy.  Skin:    General: Skin is warm and dry.  Neurological:     Mental Status: She is alert and oriented to person, place, and time.     Cranial Nerves: No cranial nerve deficit.  Psychiatric:        Mood and Affect: Mood normal.        Behavior: Behavior normal.        Thought Content: Thought content normal.     No results found for any visits on 01/22/23.    Assessment & Plan:    Routine Health Maintenance and Physical Exam  Immunization History  Administered Date(s) Administered   Influenza,inj,Quad PF,6+ Mos 12/04/2021   Influenza-Unspecified 11/14/2018, 12/28/2019, 12/27/2020, 11/20/2022   MMR 09/24/2016   Moderna Sars-Covid-2 Vaccination 05/23/2019, 06/20/2019   PFIZER(Purple Top)SARS-COV-2 Vaccination 12/27/2020   Tdap 03/02/2008, 09/30/2020   Health Maintenance  Topic Date Due   COVID-19 Vaccine (4 - 2023-24 season) 05/25/2023 (Originally 11/01/2022)   Hepatitis C Screening  01/22/2024 (Originally 10/13/2000)   HIV  Screening  01/22/2024 (Originally 10/13/1997)   Cervical Cancer Screening (HPV/Pap Cotest)  07/20/2023   DTaP/Tdap/Td (3 - Td or Tdap) 10/01/2030   INFLUENZA VACCINE  Completed   HPV VACCINES  Aged Out   Discussed health benefits of physical activity, and encouraged her to engage in regular exercise appropriate for her age and condition.  Problem List Items Addressed This Visit     Chronic major depressive disorder, recurrent episode (HCC)    Stable mood with lexapro and trazodone. under the care of Dr. Dutch Gray      GERD (gastroesophageal reflux disease)    Persistent heart burn and belching despite use of omeprazole 40mg  daily Worse with food intake,  no ABDOMEN pain, No hoarsenes or dysphagia. No change in GI/GU function. Maintain omeprazole 40mg  in AM, add famotidine 40mg  in PM Entered GI referral      Relevant Medications   famotidine (PEPCID) 40 MG tablet   omeprazole (PRILOSEC) 40 MG capsule   Other Relevant Orders   Ambulatory referral to Gastroenterology   Hypertriglyceridemia    Repeat lipid panel Advised about importance for heart healthy diet, tobacco cessation and exercise      Relevant Orders   Lipid panel   Vitamin D deficiency    Repeat vit. D      Relevant Orders   VITAMIN D 25 Hydroxy (Vit-D Deficiency, Fractures)   Other Visit Diagnoses     Encounter for preventative adult health care exam with abnormal findings    -  Primary   Relevant Orders   Comprehensive metabolic panel   CBC      Return in about 1 year (around 01/22/2024) for CPE (fasting).     Alysia Penna, NP

## 2023-01-22 NOTE — Patient Instructions (Signed)
Go to lab maintain Heart healthy diet and daily exercise.  Preventive Care 31-40 Years Old, Female Preventive care refers to lifestyle choices and visits with your health care provider that can promote health and wellness. Preventive care visits are also called wellness exams. What can I expect for my preventive care visit? Counseling Your health care provider may ask you questions about your: Medical history, including: Past medical problems. Family medical history. Pregnancy history. Current health, including: Menstrual cycle. Method of birth control. Emotional well-being. Home life and relationship well-being. Sexual activity and sexual health. Lifestyle, including: Alcohol, nicotine or tobacco, and drug use. Access to firearms. Diet, exercise, and sleep habits. Work and work Astronomer. Sunscreen use. Safety issues such as seatbelt and bike helmet use. Physical exam Your health care provider will check your: Height and weight. These may be used to calculate your BMI (body mass index). BMI is a measurement that tells if you are at a healthy weight. Waist circumference. This measures the distance around your waistline. This measurement also tells if you are at a healthy weight and may help predict your risk of certain diseases, such as type 2 diabetes and high blood pressure. Heart rate and blood pressure. Body temperature. Skin for abnormal spots. What immunizations do I need?  Vaccines are usually given at various ages, according to a schedule. Your health care provider will recommend vaccines for you based on your age, medical history, and lifestyle or other factors, such as travel or where you work. What tests do I need? Screening Your health care provider may recommend screening tests for certain conditions. This may include: Lipid and cholesterol levels. Diabetes screening. This is done by checking your blood sugar (glucose) after you have not eaten for a while  (fasting). Pelvic exam and Pap test. Hepatitis B test. Hepatitis C test. HIV (human immunodeficiency virus) test. STI (sexually transmitted infection) testing, if you are at risk. Lung cancer screening. Colorectal cancer screening. Mammogram. Talk with your health care provider about when you should start having regular mammograms. This may depend on whether you have a family history of breast cancer. BRCA-related cancer screening. This may be done if you have a family history of breast, ovarian, tubal, or peritoneal cancers. Bone density scan. This is done to screen for osteoporosis. Talk with your health care provider about your test results, treatment options, and if necessary, the need for more tests. Follow these instructions at home: Eating and drinking  Eat a diet that includes fresh fruits and vegetables, whole grains, lean protein, and low-fat dairy products. Take vitamin and mineral supplements as recommended by your health care provider. Do not drink alcohol if: Your health care provider tells you not to drink. You are pregnant, may be pregnant, or are planning to become pregnant. If you drink alcohol: Limit how much you have to 0-1 drink a day. Know how much alcohol is in your drink. In the [place] one drink equals one 12 oz bottle of beer (355 mL), one 5 oz glass of wine (148 mL), or one 1 oz glass of hard liquor (44 mL). Lifestyle Brush your teeth every morning and night with fluoride toothpaste. Floss one time each day. Exercise for at least 30 minutes 5 or more days each week. Do not use any products that contain nicotine or tobacco. These products include cigarettes, chewing tobacco, and vaping devices, such as e-cigarettes. If you need help quitting, ask your health care provider. Do not use drugs. If you are sexually active, practice  safe sex. Use a condom or other form of protection to prevent STIs. If you do not wish to become pregnant, use a form of birth control. If  you plan to become pregnant, see your health care provider for a prepregnancy visit. Take aspirin only as told by your health care provider. Make sure that you understand how much to take and what form to take. Work with your health care provider to find out whether it is safe and beneficial for you to take aspirin daily. Find healthy ways to manage stress, such as: Meditation, yoga, or listening to music. Journaling. Talking to a trusted person. Spending time with friends and family. Minimize exposure to UV radiation to reduce your risk of skin cancer. Safety Always wear your seat belt while driving or riding in a vehicle. Do not drive: If you have been drinking alcohol. Do not ride with someone who has been drinking. When you are tired or distracted. While texting. If you have been using any mind-altering substances or drugs. Wear a helmet and other protective equipment during sports activities. If you have firearms in your house, make sure you follow all gun safety procedures. Seek help if you have been physically or sexually abused. What's next? Visit your health care provider once a year for an annual wellness visit. Ask your health care provider how often you should have your eyes and teeth checked. Stay up to date on all vaccines. This information is not intended to replace advice given to you by your health care provider. Make sure you discuss any questions you have with your health care provider. Document Revised: 08/14/2020 Document Reviewed: 08/14/2020 Elsevier Patient Education  2024 [employer].

## 2023-01-22 NOTE — Assessment & Plan Note (Signed)
Repeat vit. D °

## 2023-01-22 NOTE — Assessment & Plan Note (Signed)
Repeat lipid panel Advised about importance for heart healthy diet, tobacco cessation and exercise

## 2023-01-30 ENCOUNTER — Other Ambulatory Visit: Payer: Self-pay | Admitting: Nurse Practitioner

## 2023-01-30 DIAGNOSIS — L503 Dermatographic urticaria: Secondary | ICD-10-CM

## 2023-08-09 ENCOUNTER — Other Ambulatory Visit: Payer: Self-pay | Admitting: Nurse Practitioner

## 2023-08-09 DIAGNOSIS — K219 Gastro-esophageal reflux disease without esophagitis: Secondary | ICD-10-CM

## 2023-10-01 ENCOUNTER — Other Ambulatory Visit: Payer: Self-pay | Admitting: Nurse Practitioner

## 2023-10-01 DIAGNOSIS — Z1231 Encounter for screening mammogram for malignant neoplasm of breast: Secondary | ICD-10-CM

## 2023-10-20 ENCOUNTER — Ambulatory Visit
Admission: RE | Admit: 2023-10-20 | Discharge: 2023-10-20 | Disposition: A | Discharge status: Home / Self Care | Source: Ambulatory Visit | Attending: Nurse Practitioner

## 2023-10-20 DIAGNOSIS — Z1231 Encounter for screening mammogram for malignant neoplasm of breast: Secondary | ICD-10-CM

## 2023-11-11 ENCOUNTER — Other Ambulatory Visit: Payer: Self-pay | Admitting: Nurse Practitioner

## 2023-11-11 DIAGNOSIS — K219 Gastro-esophageal reflux disease without esophagitis: Secondary | ICD-10-CM

## 2023-11-17 ENCOUNTER — Encounter: Payer: Self-pay | Admitting: Nurse Practitioner

## 2023-11-17 ENCOUNTER — Ambulatory Visit (INDEPENDENT_AMBULATORY_CARE_PROVIDER_SITE_OTHER): Payer: Commercial Managed Care - PPO | Admitting: Nurse Practitioner

## 2023-11-17 VITALS — BP 120/76 | HR 65 | Ht 64.25 in | Wt 212.0 lb

## 2023-11-17 DIAGNOSIS — Z01419 Encounter for gynecological examination (general) (routine) without abnormal findings: Secondary | ICD-10-CM

## 2023-11-17 DIAGNOSIS — Z1331 Encounter for screening for depression: Secondary | ICD-10-CM | POA: Diagnosis not present

## 2023-11-17 NOTE — Progress Notes (Addendum)
   Donna Ritter 02-07-1981 [id number]   History:  43 y.o. G2P2002 presents for annual exam. S/P 2020 TAH with bilateral salpingectomies for fibroids. Normal pap history. Smoker.   Gynecologic History Patient's last menstrual period was 08/17/2018 (approximate).   Contraception/Family planning: status post hysterectomy Sexually active: Yes  Health Maintenance Last Pap: 07/20/2018. Results were: Normal Last mammogram: 10/20/2023. Results were: Normal Last colonoscopy: Not indicated Last Dexa: Not indicated     11/17/2023    8:10 AM  Depression screen PHQ 2/9  Decreased Interest 0  Down, Depressed, Hopeless 0  PHQ - 2 Score 0     Past medical history, past surgical history, family history and social history were all reviewed and documented in the EPIC chart. 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 47 yo daughter, 62 yo son.   ROS:  A ROS was performed and pertinent positives and negatives are included.  Exam:  Vitals:   11/17/23 0808  BP: 120/76  Pulse: 65  SpO2: 97%  Weight: 212 lb (96.2 kg)  Height: 5' 4.25 (1.632 m)    Body mass index is 36.11 kg/m.  General appearance:  Normal Thyroid :  Symmetrical, normal in size, without palpable masses or nodularity. Respiratory  Auscultation:  Clear without wheezing or rhonchi Cardiovascular  Auscultation:  Regular rate, without rubs, murmurs or gallops  Edema/varicosities:  Not grossly evident Abdominal  Soft,nontender, without masses, guarding or rebound.  Liver/spleen:  No organomegaly noted  Hernia:  None appreciated  Skin  Inspection:  Grossly normal Breasts: Examined lying and sitting.   Right: Without masses, retractions, nipple discharge or axillary adenopathy.   Left: Without masses, retractions, nipple discharge or axillary adenopathy. Pelvic: External genitalia:  no lesions              Urethra:  normal appearing urethra with no masses, tenderness or lesions              Bartholins and Skenes: normal                  Vagina: normal appearing vagina with normal color and discharge, no lesions              Cervix: Absent Bimanual Exam:  Uterus:  Absent              Adnexa: no mass, fullness, tenderness              Rectovaginal: Deferred              Anus:  normal sphincter tone, no lesions  Donna Ritter, CMA present as chaperone.   Assessment/Plan:  43 y.o. [id number] for annual exam.   Well female exam with routine gynecological exam - Education provided on SBEs, importance of preventative screenings, current guidelines, high calcium diet, regular exercise, and multivitamin daily.  Labs with PCP.   Screening for cervical cancer - Normal Pap history. S/P TAH. No longer screening per guidelines.   Screening for breast cancer - Normal mammogram history.  Continue annual screenings.  Normal breast exam today.  Return in about 1 year (around 11/16/2024) for Annual.     Donna DELENA Shutter DNP, 8:22 AM 11/17/2023

## 2024-01-25 ENCOUNTER — Ambulatory Visit: Payer: Commercial Managed Care - PPO | Admitting: Nurse Practitioner

## 2024-01-25 ENCOUNTER — Ambulatory Visit: Payer: Self-pay | Admitting: Nurse Practitioner

## 2024-01-25 ENCOUNTER — Encounter: Payer: Self-pay | Admitting: Nurse Practitioner

## 2024-01-25 VITALS — BP 118/72 | HR 71 | Temp 98.3°F | Ht 64.0 in | Wt 212.0 lb

## 2024-01-25 DIAGNOSIS — Z23 Encounter for immunization: Secondary | ICD-10-CM | POA: Diagnosis not present

## 2024-01-25 DIAGNOSIS — E559 Vitamin D deficiency, unspecified: Secondary | ICD-10-CM | POA: Diagnosis not present

## 2024-01-25 DIAGNOSIS — R7301 Impaired fasting glucose: Secondary | ICD-10-CM | POA: Diagnosis not present

## 2024-01-25 DIAGNOSIS — E781 Pure hyperglyceridemia: Secondary | ICD-10-CM | POA: Diagnosis not present

## 2024-01-25 DIAGNOSIS — Z90710 Acquired absence of both cervix and uterus: Secondary | ICD-10-CM | POA: Insufficient documentation

## 2024-01-25 DIAGNOSIS — Z0001 Encounter for general adult medical examination with abnormal findings: Secondary | ICD-10-CM | POA: Diagnosis not present

## 2024-01-25 LAB — LIPID PANEL
Cholesterol: 180 mg/dL (ref 0–200)
HDL: 46.5 mg/dL (ref >39.00)
LDL Cholesterol: 108 mg/dL — ABNORMAL HIGH (ref 0–99)
NonHDL: 133.96
Total CHOL/HDL Ratio: 4
Triglycerides: 128 mg/dL (ref 0.0–149.0)
VLDL: 25.6 mg/dL (ref 0.0–40.0)

## 2024-01-25 LAB — COMPREHENSIVE METABOLIC PANEL WITH GFR
ALT: 18 U/L (ref 0–35)
AST: 16 U/L (ref 0–37)
Albumin: 3.9 g/dL (ref 3.5–5.2)
Alkaline Phosphatase: 91 U/L (ref 39–117)
BUN: 12 mg/dL (ref 6–23)
CO2: 30 meq/L (ref 19–32)
Calcium: 9.2 mg/dL (ref 8.4–10.5)
Chloride: 103 meq/L (ref 96–112)
Creatinine, Ser: 0.87 mg/dL (ref 0.40–1.20)
GFR: 82.84 mL/min (ref >60.00)
Glucose, Bld: 99 mg/dL (ref 70–99)
Potassium: 4.1 meq/L (ref 3.5–5.1)
Sodium: 138 meq/L (ref 135–145)
Total Bilirubin: 0.4 mg/dL (ref 0.2–1.2)
Total Protein: 6.4 g/dL (ref 6.0–8.3)

## 2024-01-25 LAB — TSH: TSH: 1.19 u[IU]/mL (ref 0.35–5.50)

## 2024-01-25 LAB — VITAMIN D 25 HYDROXY (VIT D DEFICIENCY, FRACTURES): VITD: 15.95 ng/mL — ABNORMAL LOW (ref 30.00–100.00)

## 2024-01-25 LAB — HEMOGLOBIN A1C: Hgb A1c MFr Bld: 5.5 % (ref 4.6–6.5)

## 2024-01-25 MED ORDER — VITAMIN D (ERGOCALCIFEROL) 1.25 MG (50000 UNIT) PO CAPS: 50000.0000 [IU] | ORAL_CAPSULE | ORAL | 0 refills | Status: AC

## 2024-01-25 NOTE — Patient Instructions (Signed)
 Go to lab Maintain Heart healthy diet and daily exercise. Maintain current medications.

## 2024-01-25 NOTE — Progress Notes (Signed)
 Complete physical exam  Patient: Donna Ritter   DOB: Dec 28, 1980   43 y.o. Female  MRN: [id number] Visit Date: 01/25/2024  Subjective:    Chief Complaint  Patient presents with   Annual Exam    FASTING  Request PAP     Donna Ritter is a 43 y.o. female who presents today for a complete physical exam. She reports consuming a general diet. Walking daily at work She generally feels well. She reports sleeping well. She does have additional problems to discuss today.  Vision:Yes Dental:Yes STD Screen:No  BP Readings from Last 3 Encounters:  01/25/24 118/72  11/17/23 120/76  01/22/23 100/62   Wt Readings from Last 3 Encounters:  01/25/24 212 lb (96.2 kg)  11/17/23 212 lb (96.2 kg)  01/22/23 216 lb 6.4 oz (98.2 kg)   Most recent fall risk assessment:    01/25/2024    8:23 AM  Fall Risk   Falls in the past year? 0  Number falls in past yr: 0  Injury with Fall? 0  Risk for fall due to : No Fall Risks  Follow up Falls evaluation completed   Depression screen:Yes - No Depression Most recent depression screenings:    01/25/2024    8:23 AM 11/17/2023    8:10 AM  PHQ 2/9 Scores  PHQ - 2 Score 0 0  PHQ- 9 Score 0    HPI  IFG (impaired fasting glucose) Repeat hgbA1c  Hypertriglyceridemia Repeat lipid panel Advised about importance for heart healthy diet, tobacco cessation and exercise  Vitamin D  deficiency Repeat vit. D   Past Medical History:  Diagnosis Date   Anemia    Anxiety    Past Surgical History:  Procedure Laterality Date   ABDOMINAL HYSTERECTOMY     CESAREAN SECTION     x2   HYSTERECTOMY ABDOMINAL WITH SALPINGECTOMY Bilateral 09/12/2018   Procedure: HYSTERECTOMY ABDOMINAL WITH SALPINGECTOMY;  Surgeon: Lavoie, Marie-Lyne, MD;  Location: [facility] [place];  Service: Gynecology;  Laterality: Bilateral;  case is for Dr. Lavoie Request to move 7:30 case ( In [facility] block) already scheduled  to follow this case. Request 2 hours.    Social History   Socioeconomic History   Marital status: Married    Spouse name: Not on file   Number of children: 2   Years of education: Not on file   Highest education level: Not on file  Occupational History   Not on file  Tobacco Use   Smoking status: Every Day    Current packs/day: 0.25    Average packs/day: 0.3 packs/day for 25.9 years (6.5 ttl pk-yrs)    Types: Cigarettes    Start date: 03/04/1998   Smokeless tobacco: Never  Vaping Use   Vaping status: Never Used  Substance and Sexual Activity   Alcohol use: Yes    Comment: 1-2drinks a month   Drug use: Never   Sexual activity: Yes    Partners: Male    Birth control/protection: Surgical    Comment: 1st intercourse- 17, partners- more than 5, hysterectomy  Other Topics Concern   Not on file  Social History Narrative   [employer]   Social Drivers of Health   Financial Resource Strain: Not on file  Food Insecurity: Not on file  Transportation Needs: Not on file  Physical Activity: Not on file  Stress: Not on file  Social Connections: Not on file  Intimate Partner Violence: Not on file   Family Status  Relation Name  Status   Mother  Deceased   Father  Alive   Brother  Alive   Nutritional Therapist  (Not Specified)   MGM  (Not Specified)   MGF  (Not Specified)   PGF  (Not Specified)   Neg Hx  (Not Specified)  No partnership data on file   Family History  Problem Relation Age of Onset   COPD Mother    Diabetes Mother    Lung cancer Mother        ?   Diabetes Father    Heart disease Father 49   Autism Brother    Kidney Stones Brother    Hypertension Paternal Uncle    Heart attack Maternal Grandmother    Leukemia Maternal Grandfather    Hypertension Paternal Grandfather    Heart attack Paternal Grandfather    Diabetes Paternal Grandfather    Breast cancer Neg Hx    BRCA 1/2 Neg Hx    No Known Allergies  Patient Care Team: Lashaun Poch, Roselie Rockford, NP as PCP - General (Internal Medicine)    Medications: Outpatient Medications Prior to Visit  Medication Sig   cetirizine  (ZYRTEC ) 10 MG tablet TAKE 1 TABLET(10 MG) BY MOUTH TWICE DAILY   escitalopram  (LEXAPRO ) 20 MG tablet Take 20 mg by mouth daily.   Multiple Vitamin (MULTIVITAMIN PO) Take by mouth.   omeprazole  (PRILOSEC) 40 MG capsule TAKE 1 CAPSULE(40 MG) BY MOUTH DAILY BEFORE BREAKFAST   traZODone  (DESYREL ) 50 MG tablet Take 50-150 mg by mouth at bedtime as needed for sleep.    No facility-administered medications prior to visit.    Review of Systems  Constitutional:  Negative for activity change, appetite change and unexpected weight change.  Respiratory: Negative.    Cardiovascular: Negative.   Gastrointestinal: Negative.   Endocrine: Negative for cold intolerance and heat intolerance.  Genitourinary: Negative.   Musculoskeletal: Negative.   Skin: Negative.   Neurological: Negative.   Hematological: Negative.   Psychiatric/Behavioral:  Negative for behavioral problems, decreased concentration, dysphoric mood, hallucinations, self-injury, sleep disturbance and suicidal ideas. The patient is not nervous/anxious.         Objective:  BP 118/72 (BP Location: Left Arm, Patient Position: Sitting, Cuff Size: Large)   Pulse 71   Temp 98.3 F (36.8 C) (Oral)   Ht 5' 4 (1.626 m)   Wt 212 lb (96.2 kg)   LMP 08/17/2018 (Approximate) Comment: spotting  SpO2 97%   BMI 36.39 kg/m     Physical Exam Vitals and nursing note reviewed.  Constitutional:      General: She is not in acute distress. HENT:     Right Ear: Tympanic membrane, ear canal and external ear normal.     Left Ear: Tympanic membrane, ear canal and external ear normal.     Nose: Nose normal.  Eyes:     Extraocular Movements: Extraocular movements intact.     Conjunctiva/sclera: Conjunctivae normal.     Pupils: Pupils are equal, round, and reactive to light.  Neck:     Thyroid : No thyroid  mass, thyromegaly or thyroid  tenderness.  Cardiovascular:      Rate and Rhythm: Normal rate and regular rhythm.     Pulses: Normal pulses.     Heart sounds: Normal heart sounds.  Pulmonary:     Effort: Pulmonary effort is normal.     Breath sounds: Normal breath sounds.  Abdominal:     General: Bowel sounds are normal.     Palpations: Abdomen is soft.  Musculoskeletal:  General: Normal range of motion.     Cervical back: Normal range of motion and neck supple.     Right lower leg: No edema.     Left lower leg: No edema.  Lymphadenopathy:     Cervical: No cervical adenopathy.  Skin:    General: Skin is warm and dry.  Neurological:     Mental Status: She is alert and oriented to person, place, and time.     Cranial Nerves: No cranial nerve deficit.  Psychiatric:        Mood and Affect: Mood normal.        Behavior: Behavior normal.        Thought Content: Thought content normal.      Results for orders placed or performed in visit on 01/25/24  Hemoglobin A1c  Result Value Ref Range   Hgb A1c MFr Bld 5.5 4.6 - 6.5 %  Lipid panel  Result Value Ref Range   Cholesterol 180 0 - 200 mg/dL   Triglycerides 871.9 0.0 - 149.0 mg/dL   HDL 53.49 >60.99 mg/dL   VLDL 74.3 0.0 - 59.9 mg/dL   LDL Cholesterol 891 (H) 0 - 99 mg/dL   Total CHOL/HDL Ratio 4    NonHDL 133.96   Comprehensive metabolic panel with GFR  Result Value Ref Range   Sodium 138 135 - 145 mEq/L   Potassium 4.1 3.5 - 5.1 mEq/L   Chloride 103 96 - 112 mEq/L   CO2 30 19 - 32 mEq/L   Glucose, Bld 99 70 - 99 mg/dL   BUN 12 6 - 23 mg/dL   Creatinine, Ser 9.12 0.40 - 1.20 mg/dL   Total Bilirubin 0.4 0.2 - 1.2 mg/dL   Alkaline Phosphatase 91 39 - 117 U/L   AST 16 0 - 37 U/L   ALT 18 0 - 35 U/L   Total Protein 6.4 6.0 - 8.3 g/dL   Albumin 3.9 3.5 - 5.2 g/dL   GFR 17.15 >39.99 mL/min   Calcium 9.2 8.4 - 10.5 mg/dL  VITAMIN D  25 Hydroxy (Vit-D Deficiency, Fractures)  Result Value Ref Range   VITD 15.95 (L) 30.00 - 100.00 ng/mL  TSH  Result Value Ref Range   TSH 1.19  0.35 - 5.50 uIU/mL      Assessment & Plan:    Routine Health Maintenance and Physical Exam  Immunization History  Administered Date(s) Administered   Influenza,inj,Quad PF,6+ Mos 12/04/2021   Influenza-Unspecified 11/14/2018, 12/28/2019, 12/27/2020, 12/04/2021, 11/20/2022, 12/16/2023   MMR 09/24/2016   Moderna Sars-Covid-2 Vaccination 05/23/2019, 06/20/2019   PFIZER(Purple Top)SARS-COV-2 Vaccination 12/27/2020   PNEUMOCOCCAL CONJUGATE-20 01/25/2024   Pfizer Covid-19 Vaccine Bivalent Booster 80yrs & up 12/25/2020   Tdap 03/02/2008, 09/30/2020    Health Maintenance  Topic Date Due   COVID-19 Vaccine (5 - 2025-26 season) 03/01/2024 (Originally 11/01/2023)   Hepatitis B Vaccines 19-59 Average Risk (1 of 3 - 19+ 3-dose series) 01/24/2025 (Originally 10/13/2001)   HPV VACCINES (1 - 3-dose SCDM series) 01/24/2025 (Originally 10/13/2009)   Hepatitis C Screening  01/24/2025 (Originally 10/13/2000)   HIV Screening  01/24/2025 (Originally 10/13/1997)   Mammogram  10/19/2025   DTaP/Tdap/Td (3 - Td or Tdap) 10/01/2030   Pneumococcal Vaccine  Completed   Influenza Vaccine  Completed   Meningococcal B Vaccine  Aged Out   Discussed health benefits of physical activity, and encouraged her to engage in regular exercise appropriate for her age and condition.  Problem List Items Addressed This Visit     Hypertriglyceridemia  Repeat lipid panel Advised about importance for heart healthy diet, tobacco cessation and exercise      Relevant Orders   Lipid panel (Completed)   IFG (impaired fasting glucose)   Repeat hgbA1c      Relevant Orders   Hemoglobin A1c (Completed)   Vitamin D  deficiency   Repeat vit. D      Relevant Orders   VITAMIN D  25 Hydroxy (Vit-D Deficiency, Fractures) (Completed)   Other Visit Diagnoses       Encounter for preventative adult health care exam with abnormal findings    -  Primary   Relevant Orders   Comprehensive metabolic panel with GFR (Completed)   TSH  (Completed)     Immunization due       Relevant Orders   Pneumococcal conjugate vaccine 20-valent (Prevnar 20) (Completed)      Return in about 1 year (around 01/24/2025) for CPE (fasting).     Roselie Mood, NP

## 2024-01-25 NOTE — Assessment & Plan Note (Signed)
 Repeat vit D

## 2024-01-25 NOTE — Assessment & Plan Note (Signed)
 Repeat lipid panel Advised about importance for heart healthy diet, tobacco cessation and exercise

## 2024-01-25 NOTE — Assessment & Plan Note (Signed)
 Repeat hgbA1c

## 2024-02-14 ENCOUNTER — Other Ambulatory Visit: Payer: Self-pay | Admitting: Nurse Practitioner

## 2024-02-14 DIAGNOSIS — K219 Gastro-esophageal reflux disease without esophagitis: Secondary | ICD-10-CM

## 2024-02-14 DIAGNOSIS — L503 Dermatographic urticaria: Secondary | ICD-10-CM

## 2024-11-22 ENCOUNTER — Ambulatory Visit: Admitting: Nurse Practitioner

## 2025-01-31 ENCOUNTER — Encounter: Admitting: Nurse Practitioner
